# Patient Record
Sex: Female | Born: 1955
Health system: Southern US, Community
[De-identification: ages and names within clinical notes are randomized; demographics above are authoritative.]

## PROBLEM LIST (undated history)

## (undated) DIAGNOSIS — I2510 Atherosclerotic heart disease of native coronary artery without angina pectoris: Secondary | ICD-10-CM

## (undated) DIAGNOSIS — G43909 Migraine, unspecified, not intractable, without status migrainosus: Secondary | ICD-10-CM

## (undated) DIAGNOSIS — F4310 Post-traumatic stress disorder, unspecified: Secondary | ICD-10-CM

## (undated) DIAGNOSIS — D219 Benign neoplasm of connective and other soft tissue, unspecified: Secondary | ICD-10-CM

## (undated) DIAGNOSIS — R918 Other nonspecific abnormal finding of lung field: Secondary | ICD-10-CM

## (undated) DIAGNOSIS — D259 Leiomyoma of uterus, unspecified: Secondary | ICD-10-CM

## (undated) DIAGNOSIS — R000 Tachycardia, unspecified: Secondary | ICD-10-CM

## (undated) DIAGNOSIS — I10 Essential (primary) hypertension: Secondary | ICD-10-CM

## (undated) DIAGNOSIS — I700 Atherosclerosis of aorta: Secondary | ICD-10-CM

## (undated) DIAGNOSIS — R1900 Intra-abdominal and pelvic swelling, mass and lump, unspecified site: Secondary | ICD-10-CM

## (undated) DIAGNOSIS — J309 Allergic rhinitis, unspecified: Secondary | ICD-10-CM

## (undated) HISTORY — DX: Leiomyoma of uterus, unspecified: D25.9

## (undated) HISTORY — DX: Atherosclerotic heart disease of native coronary artery without angina pectoris: I25.10

## (undated) HISTORY — PX: ADENOIDECTOMY: SUR15

## (undated) HISTORY — DX: Tachycardia, unspecified: R00.0

## (undated) HISTORY — DX: Post-traumatic stress disorder, unspecified: F43.10

## (undated) HISTORY — DX: Allergic rhinitis, unspecified: J30.9

## (undated) HISTORY — DX: Atherosclerosis of aorta: I70.0

## (undated) HISTORY — DX: Migraine, unspecified, not intractable, without status migrainosus: G43.909

## (undated) HISTORY — PX: TONSILLECTOMY: SUR1361

---
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
## 1997-11-25 ENCOUNTER — Other Ambulatory Visit: Admission: RE | Admit: 1997-11-25 | Discharge: 1997-11-25 | Payer: Self-pay | Admitting: Obstetrics & Gynecology

## 1997-12-23 ENCOUNTER — Ambulatory Visit (HOSPITAL_COMMUNITY): Admission: RE | Admit: 1997-12-23 | Discharge: 1997-12-23 | Payer: Self-pay | Admitting: Obstetrics & Gynecology

## 1999-05-08 ENCOUNTER — Encounter: Payer: Self-pay | Admitting: *Deleted

## 1999-05-08 ENCOUNTER — Ambulatory Visit (HOSPITAL_COMMUNITY): Admission: RE | Admit: 1999-05-08 | Discharge: 1999-05-08 | Payer: Self-pay | Admitting: *Deleted

## 2000-12-28 ENCOUNTER — Emergency Department (HOSPITAL_COMMUNITY): Admission: EM | Admit: 2000-12-28 | Discharge: 2000-12-29 | Payer: Self-pay | Admitting: Emergency Medicine

## 2001-06-21 ENCOUNTER — Ambulatory Visit (HOSPITAL_COMMUNITY): Admission: RE | Admit: 2001-06-21 | Discharge: 2001-06-21 | Payer: Self-pay | Admitting: Family Medicine

## 2001-06-21 ENCOUNTER — Encounter: Payer: Self-pay | Admitting: Family Medicine

## 2001-08-12 ENCOUNTER — Encounter: Payer: Self-pay | Admitting: Family Medicine

## 2001-08-12 ENCOUNTER — Ambulatory Visit (HOSPITAL_COMMUNITY): Admission: RE | Admit: 2001-08-12 | Discharge: 2001-08-12 | Payer: Self-pay | Admitting: Family Medicine

## 2001-10-17 ENCOUNTER — Emergency Department (HOSPITAL_COMMUNITY): Admission: EM | Admit: 2001-10-17 | Discharge: 2001-10-17 | Payer: Self-pay | Admitting: Emergency Medicine

## 2001-10-17 ENCOUNTER — Encounter: Payer: Self-pay | Admitting: Emergency Medicine

## 2002-08-22 ENCOUNTER — Encounter: Payer: Self-pay | Admitting: Family Medicine

## 2002-08-22 ENCOUNTER — Ambulatory Visit (HOSPITAL_COMMUNITY): Admission: RE | Admit: 2002-08-22 | Discharge: 2002-08-22 | Payer: Self-pay | Admitting: Family Medicine

## 2008-02-17 ENCOUNTER — Emergency Department (HOSPITAL_COMMUNITY): Admission: EM | Admit: 2008-02-17 | Discharge: 2008-02-17 | Payer: Self-pay | Admitting: Emergency Medicine

## 2008-04-04 ENCOUNTER — Emergency Department (HOSPITAL_COMMUNITY): Admission: EM | Admit: 2008-04-04 | Discharge: 2008-04-04 | Payer: Self-pay | Admitting: *Deleted

## 2008-09-05 ENCOUNTER — Emergency Department (HOSPITAL_COMMUNITY): Admission: EM | Admit: 2008-09-05 | Discharge: 2008-09-05 | Payer: Self-pay | Admitting: Emergency Medicine

## 2008-09-15 ENCOUNTER — Emergency Department (HOSPITAL_COMMUNITY): Admission: EM | Admit: 2008-09-15 | Discharge: 2008-09-15 | Payer: Self-pay | Admitting: Emergency Medicine

## 2011-05-05 LAB — URINALYSIS, ROUTINE W REFLEX MICROSCOPIC
Bilirubin Urine: NEGATIVE
Glucose, UA: NEGATIVE
Hgb urine dipstick: NEGATIVE
Ketones, ur: NEGATIVE
Nitrite: NEGATIVE
Protein, ur: NEGATIVE
Specific Gravity, Urine: 1.005
Urobilinogen, UA: 0.2
pH: 6.5

## 2011-09-01 DIAGNOSIS — G43909 Migraine, unspecified, not intractable, without status migrainosus: Secondary | ICD-10-CM | POA: Insufficient documentation

## 2015-03-24 ENCOUNTER — Other Ambulatory Visit (HOSPITAL_COMMUNITY)
Admission: RE | Admit: 2015-03-24 | Discharge: 2015-03-24 | Disposition: A | Discharge status: Home / Self Care | Payer: 59 | Source: Ambulatory Visit | Attending: Physician Assistant | Admitting: Physician Assistant

## 2015-03-24 DIAGNOSIS — Z1151 Encounter for screening for human papillomavirus (HPV): Secondary | ICD-10-CM | POA: Diagnosis present

## 2015-03-24 DIAGNOSIS — Z01419 Encounter for gynecological examination (general) (routine) without abnormal findings: Secondary | ICD-10-CM | POA: Diagnosis not present

## 2016-09-12 DIAGNOSIS — H1032 Unspecified acute conjunctivitis, left eye: Secondary | ICD-10-CM | POA: Diagnosis not present

## 2016-09-27 DIAGNOSIS — H1031 Unspecified acute conjunctivitis, right eye: Secondary | ICD-10-CM | POA: Diagnosis not present

## 2016-10-08 DIAGNOSIS — H1045 Other chronic allergic conjunctivitis: Secondary | ICD-10-CM | POA: Diagnosis not present

## 2016-11-25 ENCOUNTER — Ambulatory Visit
Admission: RE | Admit: 2016-11-25 | Discharge: 2016-11-25 | Disposition: A | Discharge status: Home / Self Care | Payer: 59 | Source: Ambulatory Visit | Attending: Family Medicine | Admitting: Family Medicine

## 2016-11-25 ENCOUNTER — Other Ambulatory Visit: Payer: Self-pay | Admitting: Family Medicine

## 2016-11-25 DIAGNOSIS — M546 Pain in thoracic spine: Secondary | ICD-10-CM

## 2016-11-25 DIAGNOSIS — M549 Dorsalgia, unspecified: Secondary | ICD-10-CM | POA: Diagnosis not present

## 2016-11-25 DIAGNOSIS — M542 Cervicalgia: Secondary | ICD-10-CM | POA: Diagnosis not present

## 2016-11-25 DIAGNOSIS — G43909 Migraine, unspecified, not intractable, without status migrainosus: Secondary | ICD-10-CM | POA: Diagnosis not present

## 2016-12-31 DIAGNOSIS — J301 Allergic rhinitis due to pollen: Secondary | ICD-10-CM | POA: Diagnosis not present

## 2016-12-31 DIAGNOSIS — R0602 Shortness of breath: Secondary | ICD-10-CM | POA: Diagnosis not present

## 2017-01-03 ENCOUNTER — Other Ambulatory Visit: Payer: Self-pay | Admitting: Family Medicine

## 2017-01-03 ENCOUNTER — Ambulatory Visit
Admission: RE | Admit: 2017-01-03 | Discharge: 2017-01-03 | Disposition: A | Discharge status: Home / Self Care | Payer: 59 | Source: Ambulatory Visit | Attending: Family Medicine | Admitting: Family Medicine

## 2017-01-03 DIAGNOSIS — R0602 Shortness of breath: Secondary | ICD-10-CM

## 2017-02-16 DIAGNOSIS — M79605 Pain in left leg: Secondary | ICD-10-CM | POA: Diagnosis not present

## 2017-02-24 DIAGNOSIS — I8002 Phlebitis and thrombophlebitis of superficial vessels of left lower extremity: Secondary | ICD-10-CM | POA: Diagnosis not present

## 2017-05-03 ENCOUNTER — Other Ambulatory Visit: Payer: Self-pay | Admitting: Family Medicine

## 2017-05-03 ENCOUNTER — Other Ambulatory Visit (HOSPITAL_COMMUNITY)
Admission: RE | Admit: 2017-05-03 | Discharge: 2017-05-03 | Disposition: A | Discharge status: Home / Self Care | Payer: 59 | Source: Ambulatory Visit | Attending: Family Medicine | Admitting: Family Medicine

## 2017-05-03 DIAGNOSIS — Z23 Encounter for immunization: Secondary | ICD-10-CM | POA: Diagnosis not present

## 2017-05-03 DIAGNOSIS — Z124 Encounter for screening for malignant neoplasm of cervix: Secondary | ICD-10-CM | POA: Diagnosis not present

## 2017-05-03 DIAGNOSIS — R102 Pelvic and perineal pain: Secondary | ICD-10-CM | POA: Diagnosis not present

## 2017-05-05 ENCOUNTER — Other Ambulatory Visit: Payer: Self-pay | Admitting: Family Medicine

## 2017-05-05 DIAGNOSIS — N852 Hypertrophy of uterus: Secondary | ICD-10-CM

## 2017-05-05 DIAGNOSIS — R102 Pelvic and perineal pain: Secondary | ICD-10-CM

## 2017-05-05 DIAGNOSIS — D259 Leiomyoma of uterus, unspecified: Secondary | ICD-10-CM

## 2017-05-05 LAB — CYTOLOGY - PAP
Bacterial vaginitis: NEGATIVE
CANDIDA VAGINITIS: NEGATIVE
Chlamydia: NEGATIVE
DIAGNOSIS: NEGATIVE
Neisseria Gonorrhea: NEGATIVE
TRICH (WINDOWPATH): NEGATIVE

## 2017-05-12 ENCOUNTER — Other Ambulatory Visit: Payer: 59

## 2017-10-30 DIAGNOSIS — N390 Urinary tract infection, site not specified: Secondary | ICD-10-CM | POA: Diagnosis not present

## 2017-10-30 DIAGNOSIS — R0602 Shortness of breath: Secondary | ICD-10-CM | POA: Diagnosis not present

## 2017-10-30 DIAGNOSIS — Z77120 Contact with and (suspected) exposure to mold (toxic): Secondary | ICD-10-CM | POA: Diagnosis not present

## 2017-11-15 DIAGNOSIS — R030 Elevated blood-pressure reading, without diagnosis of hypertension: Secondary | ICD-10-CM | POA: Diagnosis not present

## 2017-11-15 DIAGNOSIS — T7840XA Allergy, unspecified, initial encounter: Secondary | ICD-10-CM | POA: Diagnosis not present

## 2017-12-13 ENCOUNTER — Encounter: Payer: Self-pay | Admitting: Allergy and Immunology

## 2017-12-23 DIAGNOSIS — J069 Acute upper respiratory infection, unspecified: Secondary | ICD-10-CM | POA: Diagnosis not present

## 2018-01-24 ENCOUNTER — Ambulatory Visit: Payer: 59 | Admitting: Allergy and Immunology

## 2018-01-24 ENCOUNTER — Encounter: Payer: Self-pay | Admitting: Allergy and Immunology

## 2018-01-24 VITALS — BP 124/74 | HR 64 | Temp 98.3°F | Resp 18 | Ht 63.0 in | Wt 145.8 lb

## 2018-01-24 DIAGNOSIS — J3089 Other allergic rhinitis: Secondary | ICD-10-CM | POA: Diagnosis not present

## 2018-01-24 DIAGNOSIS — H1010 Acute atopic conjunctivitis, unspecified eye: Secondary | ICD-10-CM

## 2018-01-24 DIAGNOSIS — J4520 Mild intermittent asthma, uncomplicated: Secondary | ICD-10-CM | POA: Diagnosis not present

## 2018-01-24 NOTE — Progress Notes (Signed)
Dear Marilynne Drivers,  Thank you for referring Jane Price to the [facility] on 01/24/2018.   Below is a summation of this patient's evaluation and recommendations.  Thank you for your referral. I will keep you informed about this patient's response to treatment.   If you have any questions please do not hesitate to contact me.   Sincerely,  Jiles Prows, MD Allergy / Immunology [facility]   ______________________________________________________________________    NEW PATIENT NOTE  Referring Provider: Lois Huxley, [place] Primary Provider: Antony Contras, MD Date of office visit: 01/24/2018    Subjective:   Chief Complaint:  Irene Collings (DOB: 12-18-1955) is a 62 y.o. female who presents to the clinic on 01/24/2018 with a chief complaint of Allergic Rhinitis  and Asthma .     HPI: Makynli presents to this clinic in evaluation of allergies.  She has apparently been seen in this clinic over 10 years ago for issues with allergic rhinoconjunctivitis.  She was doing relatively well until February 2019 at which point in time she believed that she had exposure to mold in her house.  At that point time she became quite sick with nasal congestion and shortness of breath and sneezing and sinus headache and she went to an urgent care center and she was treated with a systemic steroid and given a short acting bronchodilator and nasal steroids and antihistamines.  When she is out of the house living in a new rental house she does much better.  When she goes back to the house to pick up material and furnishings she finds that she develops the problem once again.  In addition, sometime around February she was eating eggs and she developed a "knot in her throat".  There did not appear to be a change in talking and she could swallow food without any problem.  She also started to develop very fast  breathing and she used her short acting bronchodilator that was given to her by the urgent care center.  That was actually the first time that she used that inhaler and within 15 minutes everything resolved.  EMTs did arrive at the house for assessment but did not recommend that she go to the hospital.  She has not eaten any egg products since that event.  She also states that sometimes she has problems swallowing rice.  Neither eggs or rice ended up getting stuck in her chest rather it was an issue with transitioning the food from her mouth further down her GI tract.  History reviewed. No pertinent past medical history.  Past Surgical History:  Procedure Laterality Date  . ADENOIDECTOMY    . CESAREAN SECTION    . TONSILLECTOMY      Allergies as of 01/24/2018      Reactions   Erythromycin Base Other (See Comments)   Penicillins Rash   Sulfamethoxazole Rash      Medication List      albuterol 108 (90 Base) MCG/ACT inhaler Commonly known as:  PROVENTIL HFA;VENTOLIN HFA Inhale 1 puff into the lungs every 6 (six) hours as needed for wheezing or shortness of breath.   EXCEDRIN PO Take by mouth.   fexofenadine 180 MG tablet Commonly known as:  ALLEGRA Take 180 mg by mouth daily.   fluticasone 50 MCG/ACT nasal spray Commonly known as:  FLONASE Place 1 spray into both nostrils daily.   ibuprofen 200 MG tablet  Commonly known as:  ADVIL,MOTRIN Take 200 mg by mouth every 6 (six) hours as needed.   loteprednol 0.5 % ophthalmic suspension Commonly known as:  LOTEMAX Place 1 drop into both eyes daily as needed.   PATADAY 0.2 % Soln Generic drug:  Olopatadine HCl Apply 1 drop to eye daily.   ranitidine 75 MG tablet Commonly known as:  ZANTAC Take 75 mg by mouth daily.   rizatriptan 10 MG disintegrating tablet Commonly known as:  MAXALT-MLT Take 1 tablet at onset of migraine. May repeat in 2 hours if needed.       Review of systems negative except as noted in HPI / PMHx or  noted below:  Review of Systems  Constitutional: Negative.   HENT: Negative.   Eyes: Negative.   Respiratory: Negative.   Cardiovascular: Negative.   Gastrointestinal: Negative.   Genitourinary: Negative.   Musculoskeletal: Negative.   Skin: Negative.   Neurological: Negative.   Endo/Heme/Allergies: Negative.   Psychiatric/Behavioral: Negative.     Family History  Problem Relation Age of Onset  . COPD Mother     Social History   Socioeconomic History  . Marital status: Married    Spouse name: Not on file  . Number of children: Not on file  . Years of education: Not on file  . Highest education level: Not on file  Occupational History  . Not on file  Social Needs  . Financial resource strain: Not on file  . Food insecurity:    Worry: Not on file    Inability: Not on file  . Transportation needs:    Medical: Not on file    Non-medical: Not on file  Tobacco Use  . Smoking status: Never Smoker  . Smokeless tobacco: Never Used  Substance and Sexual Activity  . Alcohol use: Never    Frequency: Never  . Drug use: Never  . Sexual activity: Not on file  Lifestyle  . Physical activity:    Days per week: Not on file    Minutes per session: Not on file  . Stress: Not on file  Relationships  . Social connections:    Talks on phone: Not on file    Gets together: Not on file    Attends religious service: Not on file    Active member of club or organization: Not on file    Attends meetings of clubs or organizations: Not on file    Relationship status: Not on file  . Intimate partner violence:    Fear of current or ex partner: Not on file    Emotionally abused: Not on file    Physically abused: Not on file    Forced sexual activity: Not on file  Other Topics Concern  . Not on file  Social History Narrative  . Not on file    Environmental and Social history  Charline presently lives in a rental house with a dry environment, cats located inside the household, no  carpet in the bedroom, sleeping on a bed covered in plastic, sleeping on pillows with plastic, no smokers located inside the household.  Objective:   Vitals:   01/24/18 1343  BP: 124/74  Pulse: 64  Resp: 18  Temp: 98.3 F (36.8 C)  SpO2: 98%   Height: 5\' 3"  (160 cm) Weight: 145 lb 12.8 oz (66.1 kg)  Physical Exam  HENT:  Head: Normocephalic. Head is without right periorbital erythema and without left periorbital erythema.  Right Ear: Tympanic membrane, external ear and ear  canal normal.  Left Ear: Tympanic membrane, external ear and ear canal normal.  Nose: Nose normal. No mucosal edema or rhinorrhea.  Mouth/Throat: Oropharynx is clear and moist and mucous membranes are normal. No oropharyngeal exudate.  Eyes: Pupils are equal, round, and reactive to light. Conjunctivae and lids are normal.  Neck: Trachea normal. No tracheal deviation present. No thyromegaly present.  Cardiovascular: Normal rate, regular rhythm, S1 normal, S2 normal and normal heart sounds.  No murmur heard. Pulmonary/Chest: Effort normal. No stridor. No respiratory distress. She has no wheezes. She has no rales. She exhibits no tenderness.  Abdominal: Soft. She exhibits mass (Protuberant palpable fibroid). She exhibits no distension. There is no hepatosplenomegaly. There is no tenderness. There is no rebound and no guarding.  Musculoskeletal: She exhibits no edema or tenderness.  Lymphadenopathy:       Head (right side): No tonsillar adenopathy present.       Head (left side): No tonsillar adenopathy present.    She has no cervical adenopathy.    She has no axillary adenopathy.  Neurological: She is alert.  Skin: No rash noted. She is not diaphoretic. No erythema. No pallor. Nails show no clubbing.    Diagnostics: Allergy skin tests were performed.  She did not demonstrate any hypersensitivity to egg or rice.  Spirometry was performed and demonstrated an FEV1 of 2.26 @ 95 % of predicted. FEV1/FVC =  0.88  Assessment and Plan:    1. Other allergic rhinitis   2. Seasonal allergic conjunctivitis   3. Asthma, mild intermittent, well-controlled     1.  Utilize dehumidifier to lower relative humidity to below 50%  2.  Continue Flonase 1-2 sprays each nostril 3-7 times per week  3.  Continue OTC antihistamine and Pataday as albuterol MDI needed  4.  Further evaluation?  Yes, if with recurrent problems.  Catera appeared to have some issues with mold exposure and I made some suggestions that she can utilize to dehumidify her previous residence so that she can recover her furnishings and clothings without mold contamination.  She will continue to utilize anti-inflammatory agents for her upper airways and if needed a collection of other medications as noted above.  I have told her that she did not have any evidence of significant allergy directed against egg and she can go back to eating egg at this point.  Certainly if she has a significant problem that develops in the future she can return to this clinic for further evaluation.  Jiles Prows, MD Allergy / Immunology [facility]

## 2018-01-24 NOTE — Patient Instructions (Addendum)
  1.  Utilize dehumidifier to lower relative humidity to below 50%  2.  Continue Flonase 1-2 sprays each nostril 3-7 times per week  3.  Continue OTC antihistamine and Pataday as albuterol MDI needed  4.  Further evaluation?  Yes, if with recurrent problems.

## 2018-01-25 ENCOUNTER — Encounter: Payer: Self-pay | Admitting: Allergy and Immunology

## 2018-03-25 DIAGNOSIS — M25512 Pain in left shoulder: Secondary | ICD-10-CM | POA: Diagnosis not present

## 2018-04-12 DIAGNOSIS — S0083XA Contusion of other part of head, initial encounter: Secondary | ICD-10-CM | POA: Diagnosis not present

## 2018-04-14 DIAGNOSIS — M542 Cervicalgia: Secondary | ICD-10-CM | POA: Diagnosis not present

## 2018-04-14 DIAGNOSIS — G43909 Migraine, unspecified, not intractable, without status migrainosus: Secondary | ICD-10-CM | POA: Diagnosis not present

## 2018-04-14 DIAGNOSIS — R58 Hemorrhage, not elsewhere classified: Secondary | ICD-10-CM | POA: Diagnosis not present

## 2018-04-14 DIAGNOSIS — Z23 Encounter for immunization: Secondary | ICD-10-CM | POA: Diagnosis not present

## 2018-07-27 DIAGNOSIS — R030 Elevated blood-pressure reading, without diagnosis of hypertension: Secondary | ICD-10-CM | POA: Diagnosis not present

## 2018-07-27 DIAGNOSIS — J019 Acute sinusitis, unspecified: Secondary | ICD-10-CM | POA: Diagnosis not present

## 2018-07-27 DIAGNOSIS — D229 Melanocytic nevi, unspecified: Secondary | ICD-10-CM | POA: Diagnosis not present

## 2018-08-04 DIAGNOSIS — D1801 Hemangioma of skin and subcutaneous tissue: Secondary | ICD-10-CM | POA: Diagnosis not present

## 2018-08-04 DIAGNOSIS — L821 Other seborrheic keratosis: Secondary | ICD-10-CM | POA: Diagnosis not present

## 2018-08-04 DIAGNOSIS — Z85828 Personal history of other malignant neoplasm of skin: Secondary | ICD-10-CM | POA: Diagnosis not present

## 2018-08-25 ENCOUNTER — Emergency Department: Payer: 59

## 2018-08-25 ENCOUNTER — Emergency Department
Admission: EM | Admit: 2018-08-25 | Discharge: 2018-08-25 | Disposition: A | Discharge status: Home / Self Care | Payer: 59 | Attending: Emergency Medicine | Admitting: Emergency Medicine

## 2018-08-25 ENCOUNTER — Other Ambulatory Visit: Payer: Self-pay

## 2018-08-25 DIAGNOSIS — R079 Chest pain, unspecified: Secondary | ICD-10-CM | POA: Insufficient documentation

## 2018-08-25 DIAGNOSIS — I10 Essential (primary) hypertension: Secondary | ICD-10-CM | POA: Insufficient documentation

## 2018-08-25 DIAGNOSIS — R911 Solitary pulmonary nodule: Secondary | ICD-10-CM | POA: Diagnosis not present

## 2018-08-25 DIAGNOSIS — Z880 Allergy status to penicillin: Secondary | ICD-10-CM | POA: Diagnosis not present

## 2018-08-25 DIAGNOSIS — M79605 Pain in left leg: Secondary | ICD-10-CM | POA: Diagnosis not present

## 2018-08-25 DIAGNOSIS — Z79899 Other long term (current) drug therapy: Secondary | ICD-10-CM | POA: Diagnosis not present

## 2018-08-25 DIAGNOSIS — M791 Myalgia, unspecified site: Secondary | ICD-10-CM

## 2018-08-25 DIAGNOSIS — M79662 Pain in left lower leg: Secondary | ICD-10-CM | POA: Diagnosis not present

## 2018-08-25 HISTORY — DX: Essential (primary) hypertension: I10

## 2018-08-25 HISTORY — DX: Benign neoplasm of connective and other soft tissue, unspecified: D21.9

## 2018-08-25 LAB — BASIC METABOLIC PANEL
Anion gap: 9 (ref 5–15)
BUN: 9 mg/dL (ref 8–23)
CO2: 25 mmol/L (ref 22–32)
Calcium: 9.4 mg/dL (ref 8.9–10.3)
Chloride: 101 mmol/L (ref 98–111)
Creatinine, Ser: 0.74 mg/dL (ref 0.44–1.00)
GFR calc Af Amer: >60 mL/min (ref >60)
GFR calc non Af Amer: >60 mL/min (ref >60)
GLUCOSE: 110 mg/dL — AB (ref 70–99)
Potassium: 3.5 mmol/L (ref 3.5–5.1)
Sodium: 135 mmol/L (ref 135–145)

## 2018-08-25 LAB — CBC
HEMATOCRIT: 37.6 % (ref 36.0–46.0)
Hemoglobin: 12.1 g/dL (ref 12.0–15.0)
MCH: 28.1 pg (ref 26.0–34.0)
MCHC: 32.2 g/dL (ref 30.0–36.0)
MCV: 87.4 fL (ref 80.0–100.0)
Platelets: 338 10*3/uL (ref 150–400)
RBC: 4.3 MIL/uL (ref 3.87–5.11)
RDW: 14.2 % (ref 11.5–15.5)
WBC: 7.7 10*3/uL (ref 4.0–10.5)
nRBC: 0 % (ref 0.0–0.2)

## 2018-08-25 LAB — TROPONIN I
Troponin I: <0.03 ng/mL (ref <0.03)
Troponin I: <0.03 ng/mL (ref <0.03)

## 2018-08-25 LAB — FIBRIN DERIVATIVES D-DIMER (ARMC ONLY): Fibrin derivatives D-dimer (ARMC): 959.19 ng/mL (FEU) — ABNORMAL HIGH (ref 0.00–499.00)

## 2018-08-25 MED ORDER — IOHEXOL 350 MG/ML SOLN
75.0000 mL | Freq: Once | INTRAVENOUS | Status: AC | PRN
  Administered 2018-08-25: 75 mL via INTRAVENOUS

## 2018-08-25 MED ORDER — SODIUM CHLORIDE 0.9% FLUSH
3.0000 mL | Freq: Once | INTRAVENOUS | Status: AC
  Administered 2018-08-25: 3 mL via INTRAVENOUS

## 2018-08-25 MED ORDER — ASPIRIN EC 81 MG PO TBEC
162.0000 mg | DELAYED_RELEASE_TABLET | Freq: Once | ORAL | Status: AC
  Administered 2018-08-25: 162 mg via ORAL
  Filled 2018-08-25: qty 2

## 2018-08-25 NOTE — ED Triage Notes (Addendum)
C/o intermittent burning and achy chest pain that radiates to Lside neck, shoulder and arm for a few days. Also c/o Left lower leg pain. PAtient recently drove to [place] and back on 1/13. No swelling or redness noted to leg. Pt c/o sharp pain to Left leg

## 2018-08-25 NOTE — ED Provider Notes (Signed)
[facility] Emergency Department Provider Note   ___________________________________________   First MD Initiated Contact with Patient 08/25/18 1845     (approximate)  I have reviewed the triage vital signs and the nursing notes.   HISTORY  Chief Complaint Chest Pain   HPI Jane Price is a 63 y.o. female with history of fibroid uterus was presented emergency department with 2 days of numbness to the left shoulder and then chest pain for several hours this afternoon.  Says that she also felt shortness of breath during episode of chest pain this afternoon which felt like a pressure and burning type pain over the center of the chest.  It was nonradiating.  Improved when the patient rested and she says that she has been pain-free over the past several hours.  Also with aching to the left lower extremity which she said happened after a long car trip with her daughter earlier this month.  Denies any swelling to the bilateral lower extremities.  No cardiac history.  No smoking drinking or drugs.  No family history of cardiac disease.   Past Medical History:  Diagnosis Date  . Fibroids   . Hypertension     There are no active problems to display for this patient.   Past Surgical History:  Procedure Laterality Date  . ADENOIDECTOMY    . CESAREAN SECTION    . TONSILLECTOMY      Prior to Admission medications   Medication Sig Start Date End Date Taking? Authorizing Provider  albuterol (PROVENTIL HFA;VENTOLIN HFA) 108 (90 Base) MCG/ACT inhaler Inhale 1 puff into the lungs every 6 (six) hours as needed for wheezing or shortness of breath.    [provider]  Aspirin-Acetaminophen-Caffeine (EXCEDRIN PO) Take by mouth.    [provider]  fexofenadine (ALLEGRA) 180 MG tablet Take 180 mg by mouth daily.    [provider]  fluticasone (FLONASE) 50 MCG/ACT nasal spray Place 1 spray into both nostrils daily.    [provider]   ibuprofen (ADVIL,MOTRIN) 200 MG tablet Take 200 mg by mouth every 6 (six) hours as needed.    [provider]  loteprednol (LOTEMAX) 0.5 % ophthalmic suspension Place 1 drop into both eyes daily as needed.    [provider]  Olopatadine HCl (PATADAY) 0.2 % SOLN Apply 1 drop to eye daily.    [provider]  ranitidine (ZANTAC) 75 MG tablet Take 75 mg by mouth daily.    [provider]  rizatriptan (MAXALT-MLT) 10 MG disintegrating tablet Take 1 tablet at onset of migraine. May repeat in 2 hours if needed. 02/10/12   [provider]    Allergies Erythromycin base; Penicillins; and Sulfamethoxazole  Family History  Problem Relation Age of Onset  . COPD Mother     Social History Social History   Tobacco Use  . Smoking status: Never Smoker  . Smokeless tobacco: Never Used  Substance Use Topics  . Alcohol use: Never    Frequency: Never  . Drug use: Never    Review of Systems  Constitutional: No fever/chills Eyes: No visual changes. ENT: No sore throat. Cardiovascular: As above Respiratory: As above Gastrointestinal: No abdominal pain.  No nausea, no vomiting.  No diarrhea.  No constipation. Genitourinary: Negative for dysuria. Musculoskeletal: Negative for back pain. Skin: Negative for rash. Neurological: Negative for headaches, focal weakness or numbness.   ____________________________________________   PHYSICAL EXAM:  VITAL SIGNS: ED Triage Vitals  Enc Vitals Group  BP 08/25/18 1900 (!) 151/90     Pulse Rate 08/25/18 1900 67     Resp 08/25/18 1900 (!) 21     Temp --      Temp src --      SpO2 08/25/18 1900 100 %     Weight 08/25/18 1657 140 lb (63.5 kg)     Height 08/25/18 1657 5\' 3"  (1.6 m)     Head Circumference --      Peak Flow --      Pain Score 08/25/18 1656 6     Pain Loc --      Pain Edu? --      Excl. in [place]? --     Constitutional: Alert and oriented. Well appearing and in no acute distress. Eyes:  Conjunctivae are normal.  Head: Atraumatic. Nose: No congestion/rhinnorhea. Mouth/Throat: Mucous membranes are moist.  Neck: No stridor.   Cardiovascular: Normal rate, regular rhythm. Grossly normal heart sounds.  Good peripheral circulation with equal and bilateral radial as well as dorsalis pedis pulses.  Chest pain not reproducible to palpation. Respiratory: Normal respiratory effort.  No retractions. Lungs CTAB. Gastrointestinal: Soft and nontender. No distention. No CVA tenderness. Musculoskeletal: No lower extremity tenderness nor edema.  No joint effusions. Neurologic:  Normal speech and language. No gross focal neurologic deficits are appreciated. Skin:  Skin is warm, dry and intact. No rash noted. Psychiatric: Mood and affect are normal. Speech and behavior are normal.  ____________________________________________   LABS (all labs ordered are listed, but only abnormal results are displayed)  Labs Reviewed  BASIC METABOLIC PANEL - Abnormal; Notable for the following components:      Result Value   Glucose, Bld 110 (*)    All other components within normal limits  FIBRIN DERIVATIVES D-DIMER ([facility] ONLY) - Abnormal; Notable for the following components:   Fibrin derivatives D-dimer ([facility]) 959.19 (*)    All other components within normal limits  CBC  TROPONIN I  TROPONIN I   ____________________________________________  EKG  ED ECG REPORT I, Doran Stabler, the attending physician, personally viewed and interpreted this ECG.   Date: 08/25/2018  EKG Time: 1655  Rate: 89  Rhythm: normal sinus rhythm  Axis: Normal  Intervals:none  ST&T Change: No ST segment elevation or depression.  No abnormal T wave inversion.  ____________________________________________  RADIOLOGY  Chest x-ray without acute process.  Ultrasound of the left lower extremity with no evidence of DVT.  CT angiography without PE.  However, multiple scattered nodules within the lungs bilaterally.   Dominant nodule 8 mm in the left lower lobe. ____________________________________________   PROCEDURES  Procedure(s) performed:   Procedures  Critical Care performed:   ____________________________________________   INITIAL IMPRESSION / ASSESSMENT AND PLAN / ED COURSE  Pertinent labs & imaging results that were available during my care of the patient were reviewed by me and considered in my medical decision making (see chart for details).  Differential diagnosis includes, but is not limited to, ACS, aortic dissection, pulmonary embolism, cardiac tamponade, pneumothorax, pneumonia, pericarditis, myocarditis, GI-related causes including esophagitis/gastritis, and musculoskeletal chest wall pain.   As part of my medical decision making, I reviewed the following data within the [facility] Notes from prior outpatient visits  ----------------------------------------- 10:37 PM on 08/25/2018 -----------------------------------------  Patient continues to be asymptomatic at this time.  No chest pain.  No evidence of PE or DVT on imaging results.  However, we did discuss the pulmonary nodules.  She also asked about  the cramping in her lower extremities which may be due to musculoskeletal pain.  Patient is not on a statin.  Patient will given follow-up with cardiology as well as recommended follow-up with her primary care doctor for scheduling of a repeat CT scan for following of the nodules.  She is understanding the diagnosis well treatment and willing to comply. ____________________________________________   FINAL CLINICAL IMPRESSION(S) / ED DIAGNOSES  Chest pain.  Pulmonary nodules.  Myalgia  NEW MEDICATIONS STARTED DURING THIS VISIT:  New Prescriptions   No medications on file     Note:  This document was prepared using Dragon voice recognition software and may include unintentional dictation errors.     Orbie Pyo, MD 08/25/18 [phone]

## 2018-08-28 DIAGNOSIS — M542 Cervicalgia: Secondary | ICD-10-CM | POA: Diagnosis not present

## 2018-08-28 DIAGNOSIS — R911 Solitary pulmonary nodule: Secondary | ICD-10-CM | POA: Diagnosis not present

## 2018-09-11 DIAGNOSIS — M546 Pain in thoracic spine: Secondary | ICD-10-CM | POA: Insufficient documentation

## 2018-09-11 DIAGNOSIS — M542 Cervicalgia: Secondary | ICD-10-CM | POA: Diagnosis not present

## 2018-09-11 DIAGNOSIS — G8929 Other chronic pain: Secondary | ICD-10-CM | POA: Insufficient documentation

## 2019-03-02 ENCOUNTER — Other Ambulatory Visit: Payer: Self-pay | Admitting: Family Medicine

## 2019-03-02 DIAGNOSIS — R911 Solitary pulmonary nodule: Secondary | ICD-10-CM

## 2019-03-27 IMAGING — CR DG CHEST 2V
2 series · 2 of 2 positions shown · non-contrast
Comparison: None.

CLINICAL DATA: Shortness of breath for 1 year

EXAM:
CHEST  2 VIEW

[w chest pa]
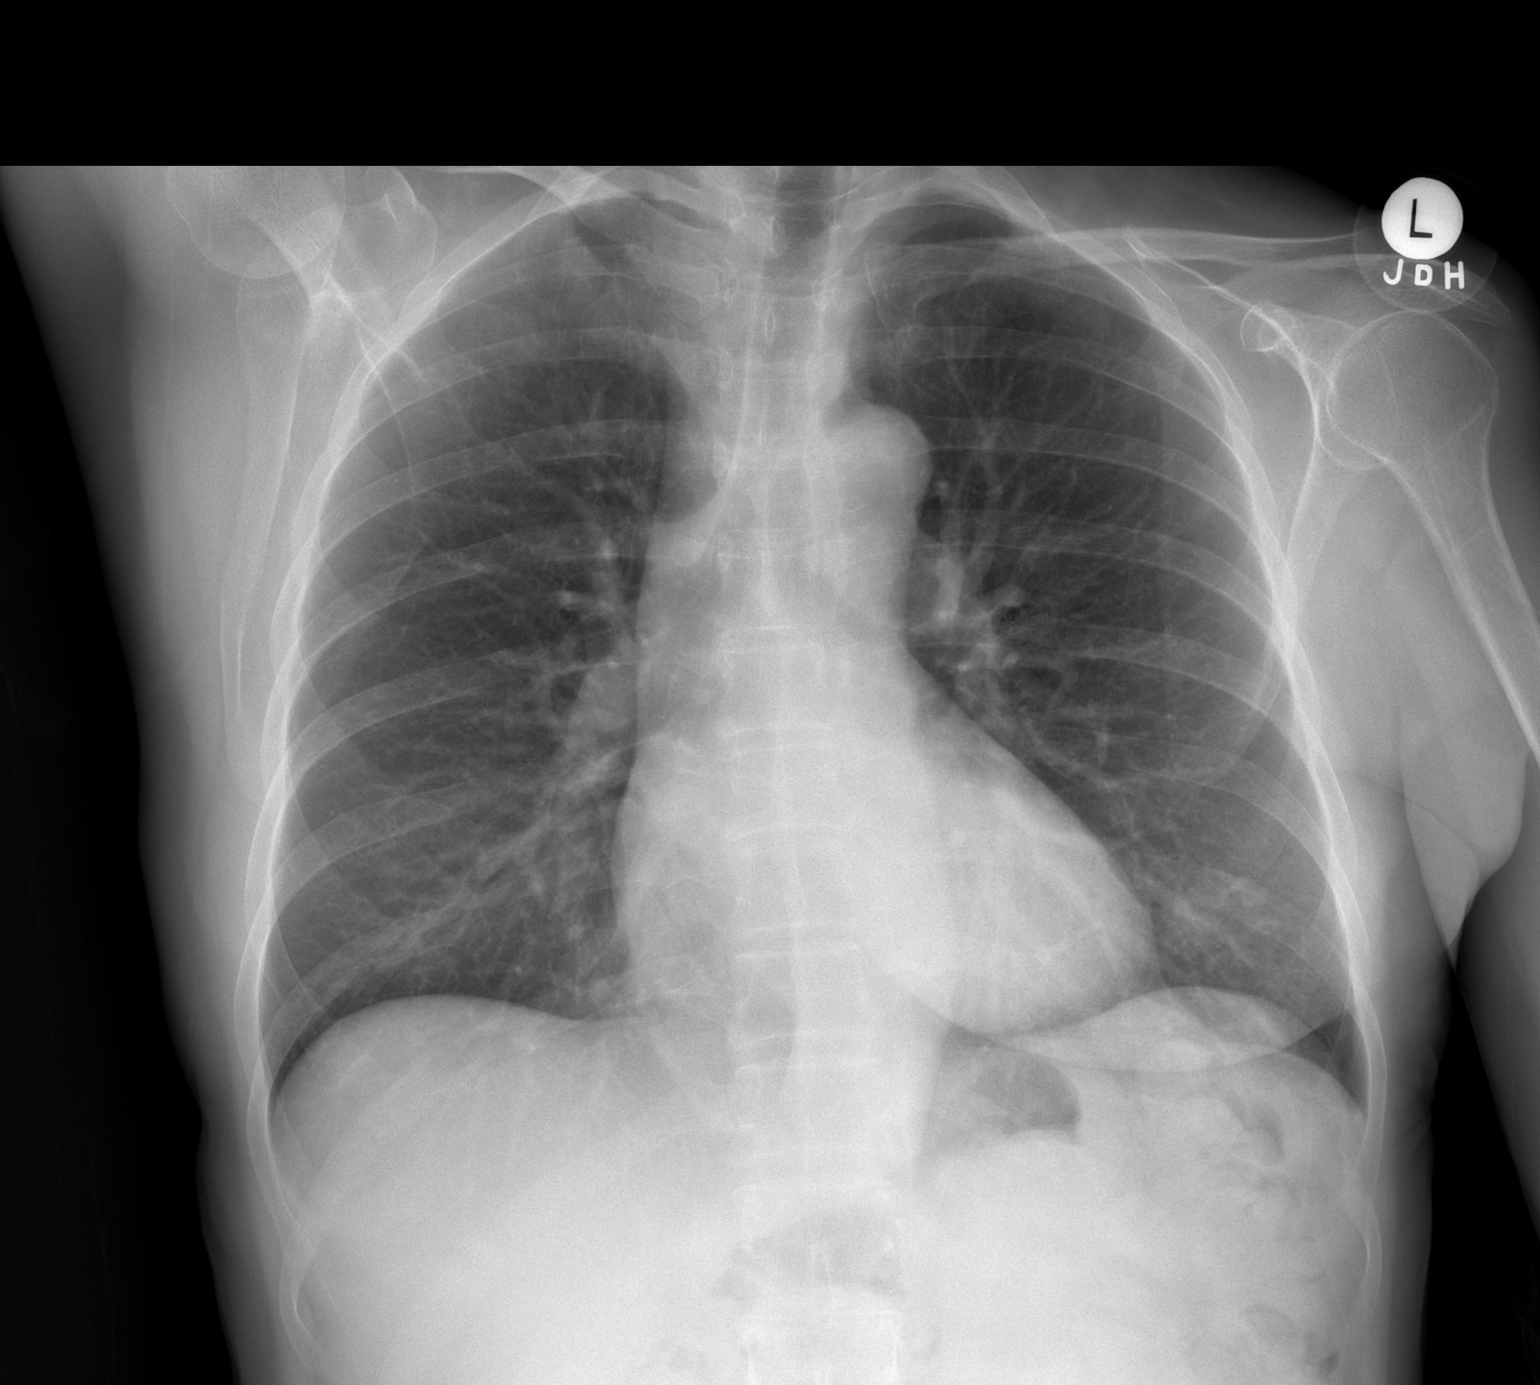

[w chest lat]
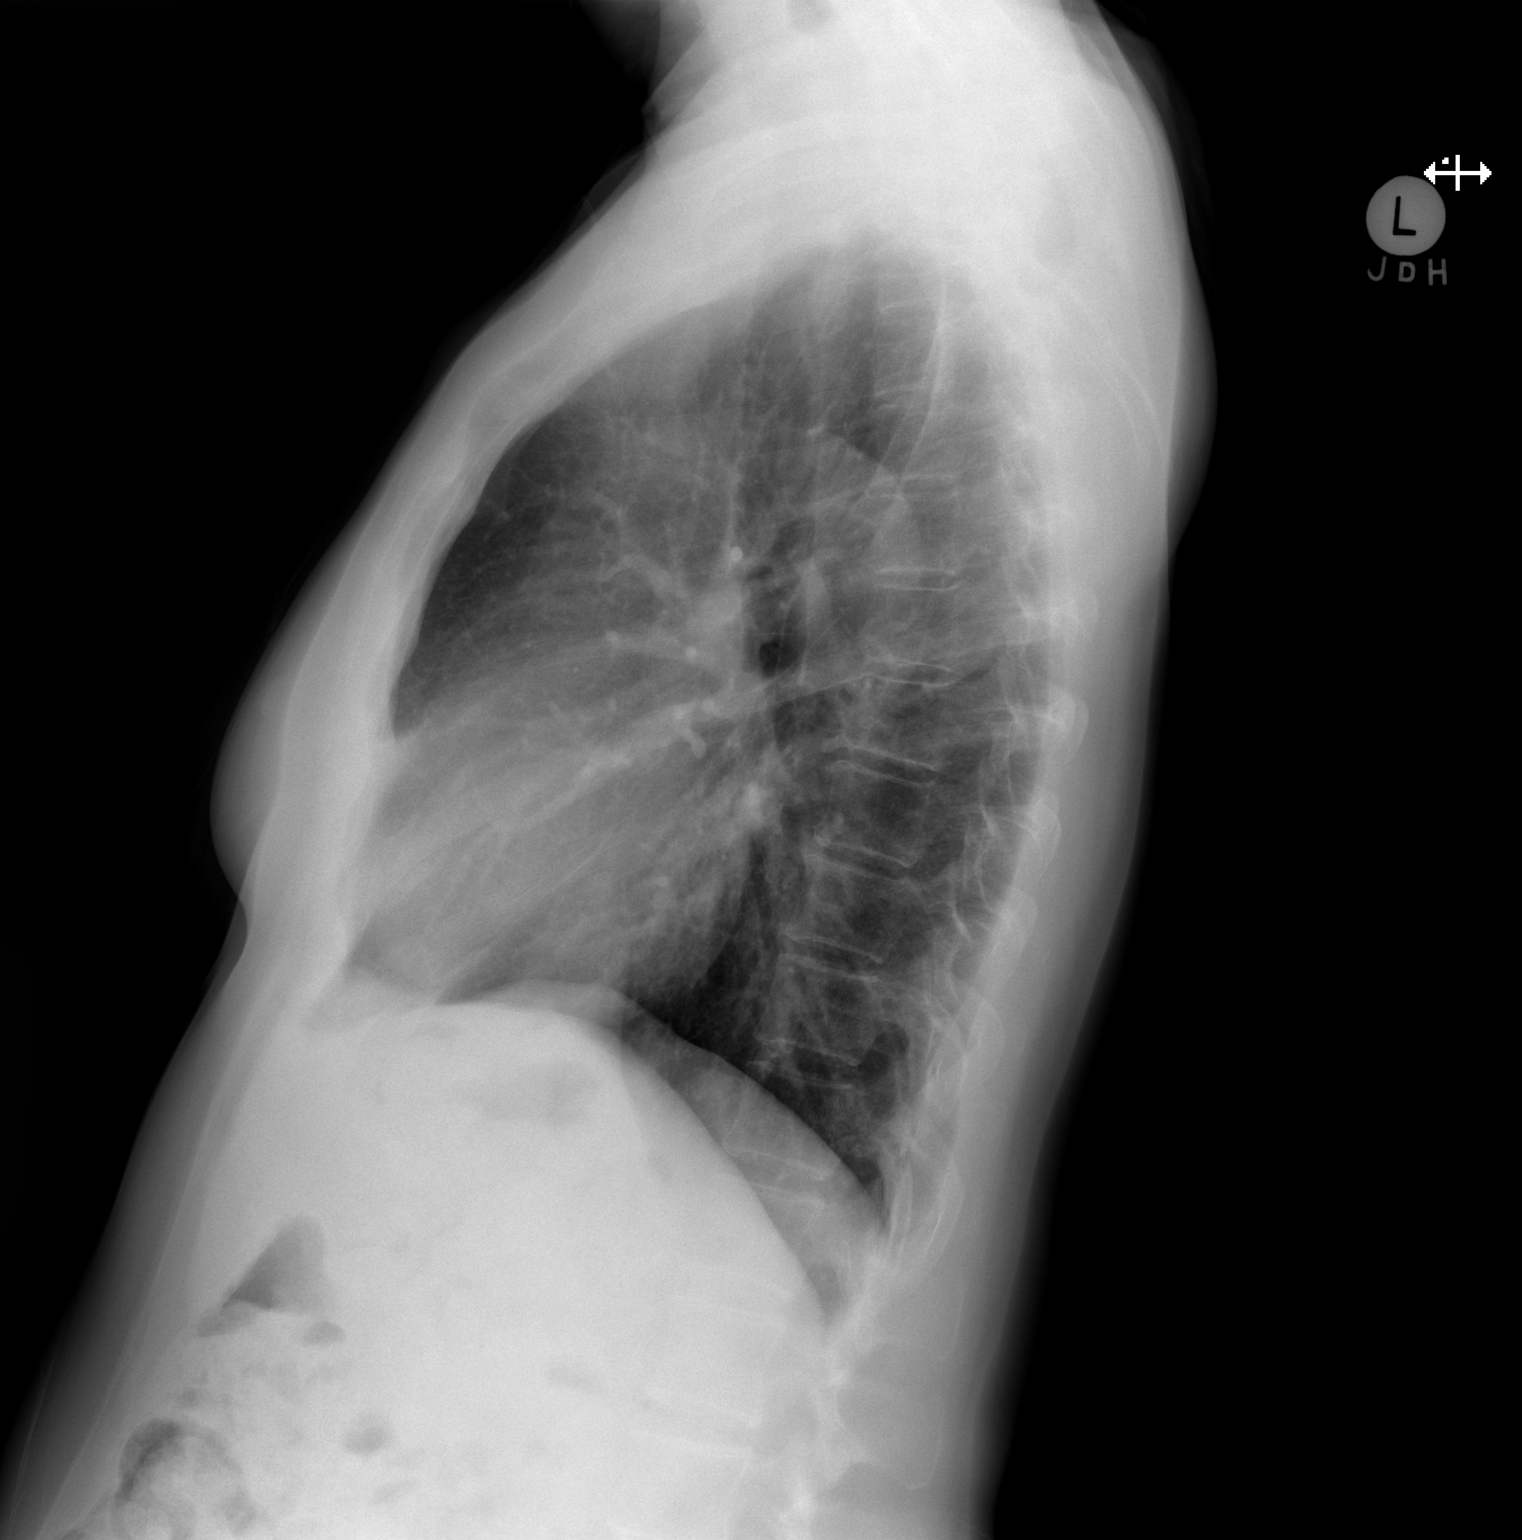

[2 of 2 positions shown; findings below may reference images not displayed]

FINDINGS: Lungs are clear. Heart size and pulmonary vascularity are normal. No
adenopathy. No bone lesions.
IMPRESSION: No edema or consolidation.

## 2019-04-03 DIAGNOSIS — R918 Other nonspecific abnormal finding of lung field: Secondary | ICD-10-CM

## 2019-04-03 DIAGNOSIS — R1900 Intra-abdominal and pelvic swelling, mass and lump, unspecified site: Secondary | ICD-10-CM

## 2019-04-03 HISTORY — DX: Intra-abdominal and pelvic swelling, mass and lump, unspecified site: R19.00

## 2019-04-03 HISTORY — DX: Other nonspecific abnormal finding of lung field: R91.8

## 2019-04-04 ENCOUNTER — Ambulatory Visit
Admission: RE | Admit: 2019-04-04 | Discharge: 2019-04-04 | Disposition: A | Discharge status: Home / Self Care | Payer: 59 | Source: Ambulatory Visit | Attending: Family Medicine | Admitting: Family Medicine

## 2019-04-04 DIAGNOSIS — R911 Solitary pulmonary nodule: Secondary | ICD-10-CM

## 2019-04-30 ENCOUNTER — Ambulatory Visit (INDEPENDENT_AMBULATORY_CARE_PROVIDER_SITE_OTHER): Payer: 59 | Admitting: Internal Medicine

## 2019-04-30 ENCOUNTER — Encounter: Payer: Self-pay | Admitting: Internal Medicine

## 2019-04-30 ENCOUNTER — Other Ambulatory Visit: Payer: Self-pay

## 2019-04-30 VITALS — BP 138/68 | HR 57 | Temp 97.2°F | Ht 63.0 in | Wt 142.8 lb

## 2019-04-30 DIAGNOSIS — R0609 Other forms of dyspnea: Secondary | ICD-10-CM | POA: Diagnosis not present

## 2019-04-30 DIAGNOSIS — R0600 Dyspnea, unspecified: Secondary | ICD-10-CM

## 2019-04-30 DIAGNOSIS — I700 Atherosclerosis of aorta: Secondary | ICD-10-CM | POA: Diagnosis not present

## 2019-04-30 DIAGNOSIS — R0789 Other chest pain: Secondary | ICD-10-CM | POA: Diagnosis not present

## 2019-04-30 DIAGNOSIS — I2510 Atherosclerotic heart disease of native coronary artery without angina pectoris: Secondary | ICD-10-CM

## 2019-04-30 DIAGNOSIS — I2584 Coronary atherosclerosis due to calcified coronary lesion: Secondary | ICD-10-CM

## 2019-04-30 NOTE — Patient Instructions (Signed)
Medication Instructions:  Your physician recommends that you continue on your current medications as directed. Please refer to the Current Medication list given to you today.  If you need a refill on your cardiac medications before your next appointment, please call your pharmacy.   Lab work: NONE If you have labs (blood work) drawn today and your tests are completely normal, you will receive your results only by: Marland Kitchen MyChart Message (if you have MyChart) OR . A paper copy in the mail If you have any lab test that is abnormal or we need to change your treatment, we will call you to review the results.  Testing/Procedures: Your physician has requested that you have a lexiscan myoview. For further information please visit [email or web address]. Please follow instruction sheet, as given.  [place]  Your caregiver has ordered a Stress Test with nuclear imaging. The purpose of this test is to evaluate the blood supply to your heart muscle. This procedure is referred to as a "Non-Invasive Stress Test." This is because other than having an IV started in your vein, nothing is inserted or "invades" your body. Cardiac stress tests are done to find areas of poor blood flow to the heart by determining the extent of coronary artery disease (CAD). Some patients exercise on a treadmill, which naturally increases the blood flow to your heart, while others who are  unable to walk on a treadmill due to physical limitations have a pharmacologic/chemical stress agent called Lexiscan . This medicine will mimic walking on a treadmill by temporarily increasing your coronary blood flow.   Please note: these test may take anywhere between 2-4 hours to complete  PLEASE REPORT TO [place] AT THE FIRST DESK WILL DIRECT YOU WHERE TO GO  Date of Procedure:_____________________________________  Arrival Time for Procedure:______________________________   PLEASE NOTIFY THE OFFICE AT  LEAST 24 HOURS IN ADVANCE IF YOU ARE UNABLE TO KEEP YOUR APPOINTMENT.  [phone] AND  PLEASE NOTIFY NUCLEAR MEDICINE AT [facility] AT LEAST 24 HOURS IN ADVANCE IF YOU ARE UNABLE TO KEEP YOUR APPOINTMENT. [phone]  How to prepare for your Myoview test:  1. Do not eat or drink after midnight 2. No caffeine for 24 hours prior to test 3. No smoking 24 hours prior to test. 4. Your medication may be taken with water.  If your doctor stopped a medication because of this test, do not take that medication. 5. Ladies, please do not wear dresses.  Skirts or pants are appropriate. Please wear a short sleeve shirt. 6. No perfume, cologne or lotion. 7. Wear comfortable walking shoes. No heels!    Follow-Up: At [facility], you and your health needs are our priority.  As part of our continuing mission to provide you with exceptional heart care, we have created designated Provider Care Teams.  These Care Teams include your primary Cardiologist (physician) and Advanced Practice Providers (APPs -  Physician Assistants and Nurse Practitioners) who all work together to provide you with the care you need, when you need it. You will need a follow up appointment in 3 months.   You may see DR Harrell Gave END or one of the following Advanced Practice Providers on your designated Care Team:   Murray Hodgkins, NP Christell Faith, PA-C . Marrianne Mood, PA-C    Cardiac Nuclear Scan A cardiac nuclear scan is a test that measures blood flow to the heart when a person is resting and when he or she is exercising. The test looks for  problems such as:  Not enough blood reaching a portion of the heart.  The heart muscle not working normally. You may need this test if:  You have heart disease.  You have had abnormal lab results.  You have had heart surgery or a balloon procedure to open up blocked arteries (angioplasty).  You have chest pain.  You have shortness of breath. In this test, a radioactive dye  (tracer) is injected into your bloodstream. After the tracer has traveled to your heart, an imaging device is used to measure how much of the tracer is absorbed by or distributed to various areas of your heart. This procedure is usually done at a hospital and takes 2-4 hours. Tell a health care provider about:  Any allergies you have.  All medicines you are taking, including vitamins, herbs, eye drops, creams, and over-the-counter medicines.  Any problems you or family members have had with anesthetic medicines.  Any blood disorders you have.  Any surgeries you have had.  Any medical conditions you have.  Whether you are pregnant or may be pregnant. What are the risks? Generally, this is a safe procedure. However, problems may occur, including:  Serious chest pain and heart attack. This is only a risk if the stress portion of the test is done.  Rapid heartbeat.  Sensation of warmth in your chest. This usually passes quickly.  Allergic reaction to the tracer. What happens before the procedure?  Ask your health care provider about changing or stopping your regular medicines. This is especially important if you are taking diabetes medicines or blood thinners.  Follow instructions from your health care provider about eating or drinking restrictions.  Remove your jewelry on the day of the procedure. What happens during the procedure?  An IV will be inserted into one of your veins.  Your health care provider will inject a small amount of radioactive tracer through the IV.  You will wait for 20-40 minutes while the tracer travels through your bloodstream.  Your heart activity will be monitored with an electrocardiogram (ECG).  You will lie down on an exam table.  Images of your heart will be taken for about 15-20 minutes.  You may also have a stress test. For this test, one of the following may be done: ? You will exercise on a treadmill or stationary bike. While you  exercise, your heart's activity will be monitored with an ECG, and your blood pressure will be checked. ? You will be given medicines that will increase blood flow to parts of your heart. This is done if you are unable to exercise.  When blood flow to your heart has peaked, a tracer will again be injected through the IV.  After 20-40 minutes, you will get back on the exam table and have more images taken of your heart.  Depending on the type of tracer used, scans may need to be repeated 3-4 hours later.  Your IV line will be removed when the procedure is over. The procedure may vary among health care providers and hospitals. What happens after the procedure?  Unless your health care provider tells you otherwise, you may return to your normal schedule, including diet, activities, and medicines.  Unless your health care provider tells you otherwise, you may increase your fluid intake. This will help to flush the contrast dye from your body. Drink enough fluid to keep your urine pale yellow.  Ask your health care provider, or the department that is doing the test: ?  When will my results be ready? ? How will I get my results? Summary  A cardiac nuclear scan measures the blood flow to the heart when a person is resting and when he or she is exercising.  Tell your health care provider if you are pregnant.  Before the procedure, ask your health care provider about changing or stopping your regular medicines. This is especially important if you are taking diabetes medicines or blood thinners.  After the procedure, unless your health care provider tells you otherwise, increase your fluid intake. This will help flush the contrast dye from your body.  After the procedure, unless your health care provider tells you otherwise, you may return to your normal schedule, including diet, activities, and medicines. This information is not intended to replace advice given to you by your health care  provider. Make sure you discuss any questions you have with your health care provider. Document Released: 08/13/2004 Document Revised: 01/02/2018 Document Reviewed: 01/02/2018 Elsevier Patient Education  2020 [employer].

## 2019-04-30 NOTE — Progress Notes (Signed)
New Outpatient Visit Date: 04/30/2019  Referring Provider: Carol Ada, MD [address] [place],  [place] [id number]  Chief Complaint: Back and shoulder pain as well as coronary artery calcification  HPI:  Jane Price is a 63 y.o. female who is being seen today for the evaluation of coronary artery calcification and aortic atherosclerosis at the request of Dr. Tamala Julian. She has a history of hypertension, PTSD, and uterine fibroids.  She was seen in the [facility] emergency department in 08/2018 with a 2-day history of numbness in the left shoulder and subsequent development of chest pain.  CTA of the chest was negative for PE in the setting of elevated d-dimer.  Troponin was negative x2.  Unenhanced CT of the chest was performed earlier this month for follow-up of pulmonary nodules.  This was notable for scattered left coronary artery calcifications as well as aortic atherosclerosis (CT scan images personally reviewed).  Today, Jane Price reports that she continues to have intermittent left upper back and shoulder pain.  Interestingly, it seems to worsen with NSAID and Maxalt use, which she has used for headaches in the past).  She recently tried taking famotidine and feels like this improved her chest pain.  It is a little bit different than what she experienced in January, as that involved central chest discomfort as well.  It occurred after a long trip.  She notes occasional "random flutters" that lasts a few seconds at a time.  She was noted to have tachycardia many years ago and was treated with low-dose propranolol.  She reports mild exertional dyspnea but walks 35 to 40 minutes a day without difficulty.  She denies orthopnea, PND, and edema.  She reports having undergone to exercise tolerance tests, most recently in [facility] about 15 years ago.  She believes both were normal.  --------------------------------------------------------------------------------------------------   Cardiovascular History & Procedures: Cardiovascular Problems:  Atypical chest pain  Coronary artery calcification and aortic atherosclerosis  Risk Factors:  Coronary artery calcification and aortic atherosclerosis as well as hypertension  Cath/PCI:  None  CV Surgery:  None  EP Procedures and Devices:  None  Non-Invasive Evaluation(s):  Patient reports 2 prior exercise tolerance tests at least 15 years ago, which she believes were negative  Recent CV Pertinent Labs: Lab Results  Component Value Date   K 3.5 08/25/2018   BUN 9 08/25/2018   CREATININE 0.74 08/25/2018    --------------------------------------------------------------------------------------------------  Past Medical History:  Diagnosis Date  . Abdominal mass 04/2019   CT scan   . Allergic rhinitis   . Atherosclerosis of aorta ([place])   . Atherosclerosis of coronary artery   . Fibroids   . Fibroids   . Hypertension   . Lung nodules 04/2019  . Migraines   . PTSD (post-traumatic stress disorder)   . Sinus tachycardia   . Uterine fibroid     Past Surgical History:  Procedure Laterality Date  . ADENOIDECTOMY    . CESAREAN SECTION    . TONSILLECTOMY      Current Meds  Medication Sig  . albuterol (PROVENTIL HFA;VENTOLIN HFA) 108 (90 Base) MCG/ACT inhaler Inhale 1 puff into the lungs every 6 (six) hours as needed for wheezing or shortness of breath.  . fexofenadine (ALLEGRA) 180 MG tablet Take 180 mg by mouth daily.  . fluticasone (FLONASE) 50 MCG/ACT nasal spray Place 1 spray into both nostrils daily.  . Olopatadine HCl (PATADAY) 0.2 % SOLN Apply 1 drop to eye daily.    Allergies:  Erythromycin base, Other, Penicillins, and Sulfamethoxazole  Social History   Tobacco Use  . Smoking status: Never Smoker  . Smokeless tobacco: Never Used  Substance Use Topics  . Alcohol use: Never    Frequency: Never  . Drug use: Never    Family History  Problem Relation Age of Onset  . COPD Mother      Review of Systems: A 12-system review of systems was performed and was negative except as noted in the HPI.  --------------------------------------------------------------------------------------------------  Physical Exam: BP 138/68 (BP Location: Right Arm, Patient Position: Sitting, Cuff Size: Normal)   Pulse (!) 57   Temp (!) 97.2 F (36.2 C)   Ht 5\' 3"  (1.6 m)   Wt 142 lb 12 oz (64.8 kg)   SpO2 98%   BMI 25.29 kg/m   General: NAD. HEENT: No conjunctival pallor or scleral icterus.  Facemask in place. Neck: Supple without lymphadenopathy, thyromegaly, JVD, or HJR. No carotid bruit. Lungs: Normal work of breathing. Clear to auscultation bilaterally without wheezes or crackles. Heart: Regular rate and rhythm without murmurs, rubs, or gallops. Non-displaced PMI. Abd: Bowel sounds present.  Nontender/nondistended.  Firm lower abdominal mass noted consistent with uterine fibroids.  No HSM. Ext: No lower extremity edema. Radial, PT, and DP pulses are 2+ bilaterally Skin: Warm and dry without rash. Neuro: CNIII-XII intact. Strength and fine-touch sensation intact in upper and lower extremities bilaterally. Psych: Normal mood and affect.  EKG: Sinus bradycardia (heart rate 57 bpm).  No significant abnormality.  Outside labs (04/16/2019): Lipid panel: Total cholesterol 201, triglycerides 75, HDL 53, LDL 133  CMP: Sodium 139, potassium 4.2, chloride 102, CO2 30, BUN 10, creatinine 0.9, glucose 98, total calcium 9.7, AST 18, ALT 13, alkaline phosphatase 66, total bilirubin 0.3, total protein 7.0, albumin 4.5  --------------------------------------------------------------------------------------------------  ASSESSMENT AND PLAN: Atypical chest pain and dyspnea on exertion the setting of coronary artery calcification and aortic atherosclerosis: Constellation of symptoms is not consistent with angina.  However, the patient was noted to have mild coronary artery calcification on recent  CT of the chest.  Only other cardiac risk factor is hypertension.  However, given continued intermittent nature of her symptoms, we have agreed to perform a functional study; we will perform a pharmacologic myocardial perfusion stress test at the patient's convenience.  In the meantime, I think it is reasonable to continue with famotidine use.  I would targeting an LDL less than 100 (ideally less than 70) in the setting of aortic atherosclerosis and coronary artery calcification.  I will defer patient of statin therapy to Dr. Tamala Julian.  If the stress test is not high risk, I think it would be reasonable for Jane Price to proceed with surgical management of her uterine fibroids.  Hypertension: Blood pressure upper normal today.  No medication changes today.  Follow-up: Return to clinic in 3 months.  Nelva Bush, MD 05/01/2019 9:40 PM

## 2019-05-01 ENCOUNTER — Encounter: Payer: Self-pay | Admitting: Internal Medicine

## 2019-05-01 ENCOUNTER — Telehealth: Payer: Self-pay | Admitting: Internal Medicine

## 2019-05-01 DIAGNOSIS — R0789 Other chest pain: Secondary | ICD-10-CM

## 2019-05-01 NOTE — Telephone Encounter (Signed)
LMOV to schedule lexi and 3 m fu per checkout 04/30/19 [facility]

## 2019-05-16 ENCOUNTER — Telehealth: Payer: Self-pay | Admitting: *Deleted

## 2019-05-16 NOTE — Telephone Encounter (Signed)
Called and left the patient a message to call the office back. Need to schedule a new patient appt  

## 2019-05-17 ENCOUNTER — Telehealth: Payer: Self-pay | Admitting: *Deleted

## 2019-05-17 NOTE — Telephone Encounter (Signed)
Called and left the patient a message to call the office back. Patient needs to be scheduled for a new appt

## 2019-05-18 ENCOUNTER — Telehealth: Payer: Self-pay | Admitting: *Deleted

## 2019-05-18 NOTE — Telephone Encounter (Signed)
Called and left the patient a message to call the office back. Patient needs to be scheduled for a new patient appt  °

## 2019-05-21 ENCOUNTER — Telehealth: Payer: Self-pay | Admitting: *Deleted

## 2019-05-21 NOTE — Telephone Encounter (Signed)
appt

## 2019-05-21 NOTE — Telephone Encounter (Signed)
Called and spoke with the patient, scheduled appt for 11/4. Gave date/time, address and policy for mask/visitors

## 2019-06-06 ENCOUNTER — Telehealth: Payer: Self-pay | Admitting: *Deleted

## 2019-06-06 ENCOUNTER — Other Ambulatory Visit: Payer: Self-pay

## 2019-06-06 ENCOUNTER — Encounter: Payer: Self-pay | Admitting: Gynecologic Oncology

## 2019-06-06 ENCOUNTER — Inpatient Hospital Stay: Payer: 59 | Attending: Gynecologic Oncology | Admitting: Gynecologic Oncology

## 2019-06-06 ENCOUNTER — Inpatient Hospital Stay: Payer: 59

## 2019-06-06 VITALS — BP 163/84 | HR 69 | Temp 98.5°F | Resp 17 | Ht 63.0 in | Wt 143.5 lb

## 2019-06-06 DIAGNOSIS — I2510 Atherosclerotic heart disease of native coronary artery without angina pectoris: Secondary | ICD-10-CM | POA: Diagnosis not present

## 2019-06-06 DIAGNOSIS — F4310 Post-traumatic stress disorder, unspecified: Secondary | ICD-10-CM | POA: Insufficient documentation

## 2019-06-06 DIAGNOSIS — R000 Tachycardia, unspecified: Secondary | ICD-10-CM | POA: Insufficient documentation

## 2019-06-06 DIAGNOSIS — R1900 Intra-abdominal and pelvic swelling, mass and lump, unspecified site: Secondary | ICD-10-CM

## 2019-06-06 DIAGNOSIS — D250 Submucous leiomyoma of uterus: Secondary | ICD-10-CM

## 2019-06-06 DIAGNOSIS — I10 Essential (primary) hypertension: Secondary | ICD-10-CM | POA: Diagnosis not present

## 2019-06-06 DIAGNOSIS — Z780 Asymptomatic menopausal state: Secondary | ICD-10-CM | POA: Diagnosis not present

## 2019-06-06 DIAGNOSIS — D259 Leiomyoma of uterus, unspecified: Secondary | ICD-10-CM

## 2019-06-06 DIAGNOSIS — Z79899 Other long term (current) drug therapy: Secondary | ICD-10-CM | POA: Insufficient documentation

## 2019-06-06 DIAGNOSIS — Z7982 Long term (current) use of aspirin: Secondary | ICD-10-CM | POA: Insufficient documentation

## 2019-06-06 DIAGNOSIS — J309 Allergic rhinitis, unspecified: Secondary | ICD-10-CM | POA: Diagnosis not present

## 2019-06-06 DIAGNOSIS — D252 Subserosal leiomyoma of uterus: Secondary | ICD-10-CM

## 2019-06-06 DIAGNOSIS — I7090 Unspecified atherosclerosis: Secondary | ICD-10-CM | POA: Diagnosis not present

## 2019-06-06 LAB — BASIC METABOLIC PANEL
Anion gap: 11 (ref 5–15)
BUN: 8 mg/dL (ref 8–23)
CO2: 25 mmol/L (ref 22–32)
Calcium: 9.2 mg/dL (ref 8.9–10.3)
Chloride: 106 mmol/L (ref 98–111)
Creatinine, Ser: 0.85 mg/dL (ref 0.44–1.00)
GFR calc Af Amer: >60 mL/min (ref >60)
GFR calc non Af Amer: >60 mL/min (ref >60)
Glucose, Bld: 101 mg/dL — ABNORMAL HIGH (ref 70–99)
Potassium: 4.1 mmol/L (ref 3.5–5.1)
Sodium: 142 mmol/L (ref 135–145)

## 2019-06-06 NOTE — Patient Instructions (Signed)
Dr Denman George is recommending that you continue your plans for cardiac optimization to prepare for surgery.  She will see you again in mid January (or later at your discretion) to discuss surgical planning,  She is recommending a robotic assisted laparoscopic total hysterectomy with removal of both fallopian tubes. She will leave the ovaries if normal appearing.The surgery may need to be converted to a large incision (laparotomy) if small incisions are not successful.   She will obtain an MRI of the pelvis and abdomen for surgical planning.  Her office can be reached at [phone].

## 2019-06-07 ENCOUNTER — Telehealth: Payer: Self-pay

## 2019-06-07 ENCOUNTER — Encounter: Payer: Self-pay | Admitting: Gynecologic Oncology

## 2019-06-07 NOTE — Progress Notes (Signed)
Consult Note: Gyn-Onc  Consult was requested by Dr. Landry Mellow for the evaluation of Jane Price 63 y.o. female  CC:  Chief Complaint  Patient presents with  . Pelvic mass  . Fibroids    Assessment/Plan:  Jane. Jane Price  is a 63 y.o.  year old with a large (30cm) fibroid uterus.   She is interested in surgery (definitive surgery with hysterectomy and bilateral salpingectomy).  She desires to retain her ovaries.  We will assess her uterus with an MRI to better characterize the size and location of the fibroids make surgical planning.  Based on my examination today I feel that she is a reasonable candidate for a robotically assisted total hysterectomy for uterus greater than 250 g with bilateral salpingectomy.  I discussed the potential risks of the surgery including bleeding, hemorrhage, blood transfusion, DVT or PE, deep pelvic or skin infection, damage to adjacent structures.  I explained that risks are higher when operating on bulky large uteri.  I explained that if we perform a hysterectomy minimally invasively this will be associated with decreased blood loss and perioperative complications.  She would require mini laparotomy for specimen delivery.  We would remove her fibroid in a contained fashion.  There would not be uncontained morcellation within the peritoneal cavity.  She desires ovary retention.  I discussed the 1 in 10 risk for reoperation necessary for development of adnexal pathology in the future.  She has some cardiac symptoms and history of coronary or cardiovascular disease and is having further work-up with Dr. And in [place].  She will have a stress test likely in January and will then return to see me following that at which time we will finalize surgical plans.   HPI: Jane Price is a 63 year old P1 who is seen in consultation at the request of Dr Landry Mellow for a large fibroid uterus.   The patient has had a 30 cm fibroid uterus for several years, possibly 8 years.   She desires definitive management for this as it is associated with some bulk symptoms and she perceives that it increases and decreases in size based on her diet.  The patient began having some work-up for chest pain in the summer 2020 this included a CT scan of the chest which was negative for PE but did capture the upper aspect of an abdominopelvic mass.  As part of her chest pain work-up she was seen by Dr. And who recommended a stress test.  She did not desire the perfusion stress test and instead hope to optimize her ambulatory skills in order to undergo a exercise stress test.  She is planning to reinitiate this in January 2021.  To follow-up the development of the abdominopelvic mass and the possible subtle increase in size she followed up with Dr. Landry Mellow.  Dr. Landry Mellow performed an ultrasound scan on May 15, 2019 which revealed a pelvic mass seen well above the umbilicus, unable to measure due to size, unable to completely visualize the entire mass.  It approximately measures 30 x 23.3 x 14.8 cm.  A Pap test was performed on 05/03/2017 which had been normal with negative vaginitis screen.  Her personal history is significant for 1 prior cesarean section and tonsillectomy.  She is treated for hypoglycemia, PTSD, migraines, and coronary artery disease with atherosclerosis seen on CT scan of the chest in September 2020.  Her GYN history is significant for known fibroid uterus.  She underwent menopause at age 82 and had no postmenopausal bleeding.  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  Her family history is significant for 2 sisters who had breast cancer diagnosis in the postmenopausal years.  Current Meds:  Outpatient Encounter Medications as of 06/06/2019  Medication Sig  . albuterol (PROVENTIL HFA;VENTOLIN HFA) 108 (90 Base) MCG/ACT inhaler Inhale 1 puff into the lungs every 6 (six) hours as needed for wheezing or shortness of breath.  Marland Kitchen aspirin 325 MG tablet Take  325 mg by mouth as needed for headache.   . fexofenadine (ALLEGRA) 180 MG tablet Take 180 mg by mouth daily.  . fluticasone (FLONASE) 50 MCG/ACT nasal spray Place 1 spray into both nostrils daily.  . Olopatadine HCl (PATADAY) 0.2 % SOLN Apply 1 drop to eye daily.  . rizatriptan (MAXALT-MLT) 10 MG disintegrating tablet Take 10 mg by mouth as needed.    No facility-administered encounter medications on file as of 06/06/2019.     Allergy:  Allergies  Allergen Reactions  . Erythromycin Base Other (See Comments)  . Other Other (See Comments)  . Ciprofloxacin     Fever  . Doxycycline     headache  . Metronidazole     bruising  . Nasacort [Triamcinolone]   . Tobramycin     headache  . Penicillins Rash  . Sulfamethoxazole Rash    Social Hx:   Social History   Socioeconomic History  . Marital status: Married    Spouse name: Not on file  . Number of children: Not on file  . Years of education: Not on file  . Highest education level: Not on file  Occupational History  . Not on file  Social Needs  . Financial resource strain: Not on file  . Food insecurity    Worry: Not on file    Inability: Not on file  . Transportation needs    Medical: Not on file    Non-medical: Not on file  Tobacco Use  . Smoking status: Never Smoker  . Smokeless tobacco: Never Used  Substance and Sexual Activity  . Alcohol use: Never    Frequency: Never  . Drug use: Never  . Sexual activity: Not on file  Lifestyle  . Physical activity    Days per week: Not on file    Minutes per session: Not on file  . Stress: Not on file  Relationships  . Social Herbalist on phone: Not on file    Gets together: Not on file    Attends religious service: Not on file    Active member of club or organization: Not on file    Attends meetings of clubs or organizations: Not on file    Relationship status: Not on file  . Intimate partner violence    Fear of current or ex partner: Not on file     Emotionally abused: Not on file    Physically abused: Not on file    Forced sexual activity: Not on file  Other Topics Concern  . Not on file  Social History Narrative  . Not on file    Past Surgical Hx:  Past Surgical History:  Procedure Laterality Date  . ADENOIDECTOMY    . CESAREAN SECTION    . TONSILLECTOMY      Past Medical Hx:  Past Medical History:  Diagnosis Date  . Abdominal mass 04/2019   CT scan   . Allergic rhinitis   . Atherosclerosis of aorta ([place])   . Atherosclerosis of coronary artery   . Fibroids   .  Fibroids   . Hypertension   . Lung nodules 04/2019  . Migraines   . PTSD (post-traumatic stress disorder)   . Sinus tachycardia   . Uterine fibroid     Past Gynecological History:  See HPI No LMP recorded. Patient is postmenopausal.  Family Hx:  Family History  Problem Relation Age of Onset  . COPD Mother   . Cancer Sister        Breast Cancer    Review of Systems:  Constitutional  Feels well,   ENT Normal appearing ears and nares bilaterally Skin/Breast  No rash, sores, jaundice, itching, dryness Cardiovascular  No chest pain, shortness of breath, or edema  Pulmonary  No cough or wheeze.  Gastro Intestinal  No nausea, vomitting, or diarrhoea. No bright red blood per rectum, no abdominal pain, change in bowel movement, or constipation.  Genito Urinary  No frequency, urgency, dysuria, no bleeding Musculo Skeletal  No myalgia, arthralgia, joint swelling or pain  Neurologic  No weakness, numbness, change in gait,  Psychology  No depression, anxiety, insomnia.   Vitals:  Blood pressure (!) 163/84, pulse 69, temperature 98.5 F (36.9 C), temperature source Temporal, resp. rate 17, height 5\' 3"  (1.6 m), weight 143 lb 8 oz (65.1 kg), SpO2 100 %.  Physical Exam: WD in NAD Neck  Supple NROM, without any enlargements.  Lymph Node Survey No cervical supraclavicular or inguinal adenopathy Cardiovascular  Pulse normal rate, regularity and  rhythm. S1 and S2 normal.  Lungs  Clear to auscultation bilateraly, without wheezes/crackles/rhonchi. Good air movement.  Skin  No rash/lesions/breakdown  Psychiatry  Alert and oriented to person, place, and time  Abdomen  Normoactive bowel sounds, abdomen soft, non-tender and thin without evidence of hernia. There is a large abdominopelvic mass filling the pelvis and abdomen extending to a handspan below xiphoid. Back No CVA tenderness Genito Urinary  Vulva/vagina: Normal external female genitalia.  No lesions. No discharge or bleeding.  Bladder/urethra:  No lesions or masses, well supported bladder  Vagina: normal  Cervix: Normal appearing, no lesions.  Uterus: 30+cm, mobile, no parametrial involvement or nodularity. Narrow lower uterine segment.  Adnexa: no discrete other masses. Rectal  deferred Extremities  No bilateral cyanosis, clubbing or edema.   Thereasa Solo, MD  06/07/2019, 4:01 PM

## 2019-06-07 NOTE — Telephone Encounter (Signed)
I let Jane Price know that her BMP results were normal.  She verbalized understanding.

## 2019-06-14 ENCOUNTER — Ambulatory Visit (HOSPITAL_COMMUNITY): Payer: 59

## 2019-06-14 ENCOUNTER — Ambulatory Visit (HOSPITAL_COMMUNITY): Admission: RE | Admit: 2019-06-14 | Payer: 59 | Source: Ambulatory Visit

## 2019-06-18 ENCOUNTER — Encounter (HOSPITAL_COMMUNITY): Payer: Self-pay

## 2019-06-18 ENCOUNTER — Ambulatory Visit (HOSPITAL_COMMUNITY)
Admission: RE | Admit: 2019-06-18 | Discharge: 2019-06-18 | Disposition: A | Discharge status: Home / Self Care | Payer: 59 | Source: Ambulatory Visit | Attending: Gynecologic Oncology | Admitting: Gynecologic Oncology

## 2019-06-18 ENCOUNTER — Other Ambulatory Visit: Payer: Self-pay

## 2019-06-18 DIAGNOSIS — D250 Submucous leiomyoma of uterus: Secondary | ICD-10-CM

## 2019-06-18 DIAGNOSIS — D252 Subserosal leiomyoma of uterus: Secondary | ICD-10-CM | POA: Diagnosis present

## 2019-06-18 DIAGNOSIS — D251 Intramural leiomyoma of uterus: Secondary | ICD-10-CM | POA: Insufficient documentation

## 2019-06-18 MED ORDER — GADOBUTROL 1 MMOL/ML IV SOLN
7.5000 mL | Freq: Once | INTRAVENOUS | Status: AC | PRN
  Administered 2019-06-18: 7.5 mL via INTRAVENOUS

## 2019-07-17 NOTE — Telephone Encounter (Signed)
Attempted to schedule.  Patient not ready and wants a call back in January.

## 2019-08-29 NOTE — Telephone Encounter (Signed)
L mom to schedule appointment

## 2019-09-14 NOTE — Telephone Encounter (Signed)
Patient wants to wait a few months.

## 2019-09-24 NOTE — Telephone Encounter (Signed)
No answer, Left message for patient

## 2019-09-25 ENCOUNTER — Telehealth: Payer: Self-pay | Admitting: Internal Medicine

## 2019-09-25 NOTE — Telephone Encounter (Signed)
Patient has been contacted at least 3 times for a recall, recall has been deleted

## 2019-11-27 ENCOUNTER — Telehealth: Payer: Self-pay | Admitting: *Deleted

## 2019-11-27 NOTE — Telephone Encounter (Signed)
Patient called back and moved her appt to the morning

## 2019-11-27 NOTE — Telephone Encounter (Signed)
Called and left a message for the patient to call the office back to move her appt

## 2019-11-27 NOTE — Progress Notes (Signed)
Follow-up Note: Gyn-Onc  Consult was requested by Dr. Landry Mellow for the evaluation of Jane Price 64 y.o. female  CC:  Chief Complaint  Patient presents with  . Fibroids    Follow up    Assessment/Plan:  Ms. Jane Price  is a 64 y.o.  year old with a large (30cm) fibroid uterus and ventral hernia.   She is interested in surgery (definitive surgery with hysterectomy and bilateral salpingectomy) in June, 2021.  She desires to retain her ovaries.  I feel that she is a reasonable candidate for a robotically assisted total hysterectomy for uterus greater than 250 g with bilateral salpingectomy with minilaparotomy for specimen delivery and ventral hernia repair. We will likely use her vertical midline incision for specimen delivery in order to repair the hernia at the same time.  I discussed the potential risks of the surgery including bleeding, hemorrhage, blood transfusion, DVT or PE, deep pelvic or skin infection, damage to adjacent structures.  I explained that risks are higher when operating on bulky large uteri.  I explained that if we perform a hysterectomy minimally invasively this will be associated with decreased blood loss and perioperative complications.  She would require mini laparotomy for specimen delivery.  We would remove her fibroid in a contained fashion.  There would not be uncontained morcellation within the peritoneal cavity.  She will obtain a stress test with Dr End in [place] and then planned surgery (if stress test normal) in June, 2021.   HPI: Ms Jane Price is a 64 year old P1 who is seen in consultation at the request of Dr Landry Mellow for a large fibroid uterus.   The patient has had a 30 cm fibroid uterus for several years, possibly 8 years.  She desires definitive management for this as it is associated with some bulk symptoms and she perceives that it increases and decreases in size based on her diet.  The patient began having some work-up for chest pain in the  summer 2020 this included a CT scan of the chest which was negative for PE but did capture the upper aspect of an abdominopelvic mass.  As part of her chest pain work-up she was seen by Dr. And who recommended a stress test.  She did not desire the perfusion stress test and instead hope to optimize her ambulatory skills in order to undergo a exercise stress test.  She is planning to reinitiate this in January 2021.  To follow-up the development of the abdominopelvic mass and the possible subtle increase in size she followed up with Dr. Landry Mellow.  Dr. Landry Mellow performed an ultrasound scan on May 15, 2019 which revealed a pelvic mass seen well above the umbilicus, unable to measure due to size, unable to completely visualize the entire mass.  It approximately measures 30 x 23.3 x 14.8 cm.  A Pap test was performed on 05/03/2017 which had been normal with negative vaginitis screen.  Her personal history is significant for 1 prior cesarean section and tonsillectomy.  She is treated for hypoglycemia, PTSD, migraines, and coronary artery disease with atherosclerosis seen on CT scan of the chest in September 2020.  Her GYN history is significant for known fibroid uterus.  She underwent menopause at age 16 and had no postmenopausal bleeding.  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  Her family history is significant for 2 sisters who had breast cancer diagnosis in the postmenopausal years.  Interval Hx:  She was offered surgery in November, 2020 but declined  at that time.  MRI from November, 2020 showed a uterus measuring 27.0 x 14.9 by 18.7 cm (volume = 3940 cm^3). A dominant large intramural fibroid is seen occupying nearly the entire uterine corpus and fundus which measures 22.6 x 14.9 x 18.7cm. The enlarged uterus extends into the mid abdomen, just to the left of midline. A 1.2 cm intramural fibroid is also seen in the right lateral lower uterine segment. There was a 2.2cm left  ovarian cyst. There was left hydroureter from compression.   She has not yet had her stress test for cardiac workup of chest pain with Dr End in [place] which was scheduled for January, 2021 because she was concerned regarding exposure to [place] during that time. She is now vaccinated and open to proceeding with this in addition to surgery.   She desires definitive surgery for her fibroid uterus in June, 2021 after she has moved house.   Current Meds:  Outpatient Encounter Medications as of 11/28/2019  Medication Sig  . albuterol (PROVENTIL HFA;VENTOLIN HFA) 108 (90 Base) MCG/ACT inhaler Inhale 1 puff into the lungs every 6 (six) hours as needed for wheezing or shortness of breath.  Marland Kitchen aspirin 325 MG tablet Take 325 mg by mouth as needed for headache.   . fexofenadine (ALLEGRA) 180 MG tablet Take 180 mg by mouth daily.  . fluticasone (FLONASE) 50 MCG/ACT nasal spray Place 1 spray into both nostrils daily.  . Olopatadine HCl (PATADAY) 0.2 % SOLN Apply 1 drop to eye daily.  . rizatriptan (MAXALT-MLT) 10 MG disintegrating tablet Take 10 mg by mouth as needed.    No facility-administered encounter medications on file as of 11/28/2019.    Allergy:  Allergies  Allergen Reactions  . Erythromycin Base Other (See Comments)  . Other Other (See Comments)  . Ciprofloxacin     Fever  . Doxycycline     headache  . Metronidazole     bruising  . Nasacort [Triamcinolone]   . Tobramycin     headache  . Penicillins Rash  . Sulfamethoxazole Rash    Social Hx:   Social History   Socioeconomic History  . Marital status: Married    Spouse name: Not on file  . Number of children: Not on file  . Years of education: Not on file  . Highest education level: Not on file  Occupational History  . Not on file  Tobacco Use  . Smoking status: Never Smoker  . Smokeless tobacco: Never Used  Substance and Sexual Activity  . Alcohol use: Never  . Drug use: Never  . Sexual activity: Not on file  Other  Topics Concern  . Not on file  Social History Narrative  . Not on file   Social Determinants of Health   Financial Resource Strain:   . Difficulty of Paying Living Expenses:   Food Insecurity:   . Worried About Charity fundraiser in the Last Year:   . Arboriculturist in the Last Year:   Transportation Needs:   . Film/video editor (Medical):   Marland Kitchen Lack of Transportation (Non-Medical):   Physical Activity:   . Days of Exercise per Week:   . Minutes of Exercise per Session:   Stress:   . Feeling of Stress :   Social Connections:   . Frequency of Communication with Friends and Family:   . Frequency of Social Gatherings with Friends and Family:   . Attends Religious Services:   . Active Member of Clubs or  Organizations:   . Attends Archivist Meetings:   Marland Kitchen Marital Status:   Intimate Partner Violence:   . Fear of Current or Ex-Partner:   . Emotionally Abused:   Marland Kitchen Physically Abused:   . Sexually Abused:     Past Surgical Hx:  Past Surgical History:  Procedure Laterality Date  . ADENOIDECTOMY    . CESAREAN SECTION    . TONSILLECTOMY      Past Medical Hx:  Past Medical History:  Diagnosis Date  . Abdominal mass 04/2019   CT scan   . Allergic rhinitis   . Atherosclerosis of aorta ([place])   . Atherosclerosis of coronary artery   . Fibroids   . Fibroids   . Hypertension   . Lung nodules 04/2019  . Migraines   . PTSD (post-traumatic stress disorder)   . Sinus tachycardia   . Uterine fibroid     Past Gynecological History:  See HPI No LMP recorded. Patient is postmenopausal.  Family Hx:  Family History  Problem Relation Age of Onset  . COPD Mother   . Cancer Sister        Breast Cancer    Review of Systems:  Constitutional  Feels well,   ENT Normal appearing ears and nares bilaterally Skin/Breast  No rash, sores, jaundice, itching, dryness Cardiovascular  No chest pain, shortness of breath, or edema  Pulmonary  No cough or wheeze.   Gastro Intestinal  No nausea, vomitting, or diarrhoea. No bright red blood per rectum, no abdominal pain, change in bowel movement, or constipation.  Genito Urinary  No frequency, urgency, dysuria, no bleeding Musculo Skeletal  No myalgia, arthralgia, joint swelling or pain  Neurologic  No weakness, numbness, change in gait,  Psychology  No depression, anxiety, insomnia.   Vitals:  Blood pressure 134/77, pulse 67, temperature 98.9 F (37.2 C), temperature source Temporal, height 5\' 3"  (1.6 m), weight 147 lb 2 oz (66.7 kg), SpO2 99 %.  Physical Exam: WD in NAD Neck  Supple NROM, without any enlargements.  Lymph Node Survey No cervical supraclavicular or inguinal adenopathy Cardiovascular  Pulse normal rate, regularity and rhythm. S1 and S2 normal.  Lungs  Clear to auscultation bilateraly, without wheezes/crackles/rhonchi. Good air movement.  Skin  No rash/lesions/breakdown  Psychiatry  Alert and oriented to person, place, and time  Abdomen  Normoactive bowel sounds, abdomen soft, non-tender and thin with soft, reducible, 5cm ventral hernia at the superior aspect of her infraumbilical vertical midline incision. There is a large abdominopelvic mass filling the pelvis and abdomen extending to a handspan below xiphoid. Back No CVA tenderness Genito Urinary  Vulva/vagina: Normal external female genitalia.  No lesions. No discharge or bleeding.  Bladder/urethra:  No lesions or masses, well supported bladder  Vagina: normal  Cervix: Normal appearing, no lesions.  Uterus: 30+cm, mobile, no parametrial involvement or nodularity. Narrow lower uterine segment.  Adnexa: no discrete other masses. Rectal  deferred Extremities  No bilateral cyanosis, clubbing or edema.   Thereasa Solo, MD  11/28/2019, 11:58 AM

## 2019-11-28 ENCOUNTER — Inpatient Hospital Stay: Payer: BC Managed Care – PPO | Attending: Gynecologic Oncology | Admitting: Gynecologic Oncology

## 2019-11-28 ENCOUNTER — Other Ambulatory Visit: Payer: Self-pay

## 2019-11-28 ENCOUNTER — Encounter: Payer: Self-pay | Admitting: Gynecologic Oncology

## 2019-11-28 VITALS — BP 134/77 | HR 67 | Temp 98.9°F | Ht 63.0 in | Wt 147.1 lb

## 2019-11-28 DIAGNOSIS — D250 Submucous leiomyoma of uterus: Secondary | ICD-10-CM | POA: Diagnosis not present

## 2019-11-28 DIAGNOSIS — D251 Intramural leiomyoma of uterus: Secondary | ICD-10-CM | POA: Insufficient documentation

## 2019-11-28 DIAGNOSIS — K429 Umbilical hernia without obstruction or gangrene: Secondary | ICD-10-CM

## 2019-11-28 DIAGNOSIS — K439 Ventral hernia without obstruction or gangrene: Secondary | ICD-10-CM | POA: Diagnosis not present

## 2019-11-28 DIAGNOSIS — D252 Subserosal leiomyoma of uterus: Secondary | ICD-10-CM | POA: Insufficient documentation

## 2019-11-28 DIAGNOSIS — R1900 Intra-abdominal and pelvic swelling, mass and lump, unspecified site: Secondary | ICD-10-CM

## 2019-11-28 NOTE — Patient Instructions (Signed)
Preparing for your Surgery  Plan for surgery on January 10, 2020 with Dr. Everitt Amber at [facility] will be scheduled for a robotic assisted total laparoscopic hysterectomy, bilateral salpingectomy, mini laparotomy, hernia repair.   You will need to have your stress test prior to surgery.  STOP TAKING YOUR ASPIRIN FOR AT LEAST ONE MONTH BEFORE SURGERY.  Pre-operative Testing -You will receive a phone call from presurgical testing at [facility] to arrange for a pre-operative appointment over the phone, lab appointment, and COVID test. The COVID test normally happens 3 days prior to the surgery and they ask that you self quarantine after the test up until surgery to decrease chance of exposure.  -Bring your insurance card, copy of an advanced directive if applicable, medication list  -At that visit, you will be asked to sign a consent for a possible blood transfusion in case a transfusion becomes necessary during surgery.  The need for a blood transfusion is rare but having consent is a necessary part of your care.     -Do not take supplements such as fish oil (omega 3), red yeast rice, turmeric before your surgery.   Day Before Surgery at [place] will be asked to take in a light diet the day before surgery.  Avoid carbonated beverages.  You will be advised you can have clear liquids after midnight and up until 3 hours before your surgery.    Eat a light diet the day before surgery.  Examples including soups, broths, toast, yogurt, mashed potatoes.  Things to avoid include carbonated beverages (fizzy beverages), raw fruits and raw vegetables, or beans.   If your bowels are filled with gas, your surgeon will have difficulty visualizing your pelvic organs which increases your surgical risks.  Your role in recovery Your role is to become active as soon as directed by your doctor, while still giving yourself time to heal.  Rest when you feel tired. You will be asked to do  the following in order to speed your recovery:  - Cough and breathe deeply. This helps to clear and expand your lungs and can prevent pneumonia after surgery.  - [place]. Do mild physical activity. Walking or moving your legs help your circulation and body functions return to normal. Do not try to get up or walk alone the first time after surgery.   -If you develop swelling on one leg or the other, pain in the back of your leg, redness/warmth in one of your legs, please call the office or go to the Emergency Room to have a doppler to rule out a blood clot. For shortness of breath, chest pain-seek care in the Emergency Room as soon as possible. - Actively manage your pain. Managing your pain lets you move in comfort. We will ask you to rate your pain on a scale of zero to 10. It is your responsibility to tell your doctor or nurse where and how much you hurt so your pain can be treated.  Special Considerations -If you are diabetic, you may be placed on insulin after surgery to have closer control over your blood sugars to promote healing and recovery.  This does not mean that you will be discharged on insulin.  If applicable, your oral antidiabetics will be resumed when you are tolerating a solid diet.  -Your final pathology results from surgery should be available around one week after surgery and the results will be relayed to you when  available.  -Dr. Lahoma Crocker is the surgeon that assists your GYN Oncologist with surgery.  If you end up staying the night, the next day after your surgery you will either see Dr. Denman George, Dr. Berline Lopes, or Dr. Lahoma Crocker.  -FMLA forms can be faxed to [phone] and please allow 5-7 business days for completion.  Pain Management After Surgery -You will be prescribed your pain medication and bowel regimen medications before surgery so that you can have these available when you are discharged from the hospital. The pain medication is  for use ONLY AFTER surgery and a new prescription will not be given.   -Make sure that you have Tylenol and Ibuprofen at home to use on a regular basis after surgery for pain control. We recommend alternating the medications every hour to six hours since they work differently and are processed in the body differently for pain relief.  -Review the attached handout on narcotic use and their risks and side effects.   Bowel Regimen -You will be prescribed Sennakot-S to take nightly to prevent constipation especially if you are taking the narcotic pain medication intermittently.  It is important to prevent constipation and drink adequate amounts of liquids. You can stop taking this medication when you are not taking pain medication and you are back on your normal bowel routine.  Risks of Surgery Risks of surgery are low but include bleeding, infection, damage to surrounding structures, re-operation, blood clots, and very rarely death.   Blood Transfusion Information (For the consent to be signed before surgery)  We will be checking your blood type before surgery so in case of emergencies, we will know what type of blood you would need.                                            WHAT IS A BLOOD TRANSFUSION?  A transfusion is the replacement of blood or some of its parts. Blood is made up of multiple cells which provide different functions.  Red blood cells carry oxygen and are used for blood loss replacement.  White blood cells fight against infection.  Platelets control bleeding.  Plasma helps clot blood.  Other blood products are available for specialized needs, such as hemophilia or other clotting disorders. BEFORE THE TRANSFUSION  Who gives blood for transfusions?   You may be able to donate blood to be used at a later date on yourself (autologous donation).  Relatives can be asked to donate blood. This is generally not any safer than if you have received blood from a stranger. The  same precautions are taken to ensure safety when a relative's blood is donated.  Healthy volunteers who are fully evaluated to make sure their blood is safe. This is blood bank blood. Transfusion therapy is the safest it has ever been in the practice of medicine. Before blood is taken from a donor, a complete history is taken to make sure that person has no history of diseases nor engages in risky social behavior (examples are intravenous drug use or sexual activity with multiple partners). The donor's travel history is screened to minimize risk of transmitting infections, such as malaria. The donated blood is tested for signs of infectious diseases, such as HIV and hepatitis. The blood is then tested to be sure it is compatible with you in order to minimize the chance of a transfusion reaction. If  you or a relative donates blood, this is often done in anticipation of surgery and is not appropriate for emergency situations. It takes many days to process the donated blood. RISKS AND COMPLICATIONS Although transfusion therapy is very safe and saves many lives, the main dangers of transfusion include:   Getting an infectious disease.  Developing a transfusion reaction. This is an allergic reaction to something in the blood you were given. Every precaution is taken to prevent this. The decision to have a blood transfusion has been considered carefully by your caregiver before blood is given. Blood is not given unless the benefits outweigh the risks.  AFTER SURGERY INSTRUCTIONS  Return to work: 4-6 weeks if applicable  Activity: 1. Be up and out of the bed during the day.  Take a nap if needed.  You may walk up steps but be careful and use the hand rail.  Stair climbing will tire you more than you think, you may need to stop part way and rest.   2. No lifting or straining for 6 weeks over 10 pounds. No pushing, pulling, straining for 6 weeks.  3. No driving for 1 week(s).  Do not drive if you are  taking narcotic pain medicine and make sure that your reaction time has returned.   4. You can shower as soon as the next day after surgery. Shower daily.  Use soap and water on your incision and pat dry; don't rub.  No tub baths or submerging your body in water until cleared by your surgeon. If you have the soap that was given to you by pre-surgical testing that was used before surgery, you do not need to use it afterwards because this can irritate your incisions.   5. No sexual activity and nothing in the vagina for 8 weeks.  6. You may experience a small amount of clear drainage from your incisions, which is normal.  If the drainage persists, increases, or changes color please call the office.  7. Do not use creams, lotions, or ointments such as neosporin on your incisions after surgery until advised by your surgeon because they can cause removal of the dermabond glue on your incisions.    8. You may experience vaginal spotting after surgery or around the 6-8 week mark from surgery when the stitches at the top of the vagina begin to dissolve.  The spotting is normal but if you experience heavy bleeding, call our office.  9. Take Tylenol or ibuprofen first for pain and only use narcotic pain medication for severe pain not relieved by the Tylenol or Ibuprofen.  Monitor your Tylenol intake to a max of 4,000 mg.  Diet: 1. Low sodium Heart Healthy Diet is recommended.  2. It is safe to use a laxative, such as Miralax or Colace, if you have difficulty moving your bowels. You can take Sennakot at bedtime every evening to keep bowel movements regular and to prevent constipation.    Wound Care: 1. Keep clean and dry.  Shower daily.  Reasons to call the Doctor:  Fever - Oral temperature greater than 100.4 degrees Fahrenheit  Foul-smelling vaginal discharge  Difficulty urinating  Nausea and vomiting  Increased pain at the site of the incision that is unrelieved with pain  medicine.  Difficulty breathing with or without chest pain  New calf pain especially if only on one side  Sudden, continuing increased vaginal bleeding with or without clots.   Contacts: For questions or concerns you should contact:  Dr. Terrence Dupont  Denman George at [phone]  Joylene John, NP at [phone]  After Hours: call [phone] and have the GYN Oncologist paged/contacted

## 2019-11-29 ENCOUNTER — Encounter: Payer: Self-pay | Admitting: Gynecologic Oncology

## 2019-11-29 DIAGNOSIS — K439 Ventral hernia without obstruction or gangrene: Secondary | ICD-10-CM | POA: Insufficient documentation

## 2019-11-30 NOTE — Telephone Encounter (Signed)
lmom to schedule

## 2019-12-03 ENCOUNTER — Telehealth: Payer: Self-pay | Admitting: *Deleted

## 2019-12-03 NOTE — Telephone Encounter (Signed)
To Dr. Saunders Revel to review.  Dr. Saunders Revel, the lexiscan was ordered back in September 2020. Thoughts at this point on doing just a GXT?   Thanks!

## 2019-12-03 NOTE — Telephone Encounter (Signed)
Called and spoke with the patient regarding her stress test. Patient stated that the office is working on scheduling the test and were going to call her back today. Ask the patient to call the office once the test is scheduled so we can follow the results

## 2019-12-03 NOTE — Telephone Encounter (Signed)
If Jane Price prefers an exercise tolerance test, I am okay with that.  She should f/u in the office shortly after completion of the stress test.  Nelva Bush, MD [place]

## 2019-12-03 NOTE — Telephone Encounter (Signed)
Patient declined to schedule Lexi  and would only like to do a treadmill test.    Please advise.

## 2019-12-04 ENCOUNTER — Encounter: Payer: Self-pay | Admitting: Internal Medicine

## 2019-12-04 NOTE — Telephone Encounter (Signed)
Order for GXT entered.  Routing to scheduling to arrange for testing and f/u appt in office thereafter.  Thanks!

## 2019-12-04 NOTE — Addendum Note (Signed)
Addended by: Annia Belt on: 12/04/2019 08:55 AM   Modules accepted: Orders

## 2019-12-04 NOTE — Telephone Encounter (Signed)
Scheduled and mailed reminders

## 2019-12-10 ENCOUNTER — Other Ambulatory Visit
Admission: RE | Admit: 2019-12-10 | Discharge: 2019-12-10 | Disposition: A | Discharge status: Home / Self Care | Payer: BC Managed Care – PPO | Source: Ambulatory Visit | Attending: Internal Medicine | Admitting: Internal Medicine

## 2019-12-10 DIAGNOSIS — Z20822 Contact with and (suspected) exposure to covid-19: Secondary | ICD-10-CM | POA: Insufficient documentation

## 2019-12-10 DIAGNOSIS — Z01812 Encounter for preprocedural laboratory examination: Secondary | ICD-10-CM | POA: Diagnosis not present

## 2019-12-10 LAB — SARS CORONAVIRUS 2 (TAT 6-24 HRS): SARS Coronavirus 2: NEGATIVE

## 2019-12-11 ENCOUNTER — Other Ambulatory Visit: Payer: Self-pay

## 2019-12-11 ENCOUNTER — Ambulatory Visit (INDEPENDENT_AMBULATORY_CARE_PROVIDER_SITE_OTHER): Payer: BC Managed Care – PPO

## 2019-12-11 DIAGNOSIS — R0789 Other chest pain: Secondary | ICD-10-CM

## 2019-12-11 LAB — EXERCISE TOLERANCE TEST
Estimated workload: 10.1 METS
Exercise duration (min): 7 min
Exercise duration (sec): 0 s
MPHR: 155 {beats}/min
Peak HR: 160 {beats}/min
Percent HR: 103 %
RPE: 16
Rest HR: 69 {beats}/min

## 2019-12-12 NOTE — Progress Notes (Signed)
Follow-up Outpatient Visit Date: 12/13/2019  Primary Care Provider: Carol Ada, [place] [id number]  Chief Complaint: Follow-up chest pain and coronary artery calcification  HPI:  Jane Price is a 64 y.o. female with history of hypertension, PTSD, and uterine fibroids, who presents for follow-up of coronary artery calcification.  I saw [name] once in 04/2019 for evaluation of incidentally noted coronary artery calcification as well as back and shoulder pain.  I recommend noninvasive ischemia testing, which the patient elected to defer until earlier this week.  Exercise tolerance test was low risk without evidence of inducible ischemia.  Today, [name] reports that she is feeling well.  She has not had any further episodes of chest pain or shortness of breath.  She has occasional acceleration in her heart rate lasting a few seconds at a time.  This is not associated with any particular activity.  There are no associated symptoms.  She is planning to undergo hysterectomy for management of her uterine fibroids next month.  She is trying to walk regularly and does not have any limitations.  --------------------------------------------------------------------------------------------------  Cardiovascular History & Procedures: Cardiovascular Problems:  Atypical chest pain  Coronary artery calcification and aortic atherosclerosis  Risk Factors:  Coronary artery calcification and aortic atherosclerosis as well as hypertension  Cath/PCI:  None  CV Surgery:  None  EP Procedures and Devices:  None  Non-Invasive Evaluation(s):  Exercise tolerance test (12/11/2019): Negative stress test without evidence of ischemia at workload achieved.  Patient exercised for 7 minutes, achieving maximal heart rate of 160 bpm (103% MPHR).  Recent CV Pertinent Labs: Lab Results  Component Value Date   K 4.1 06/06/2019   BUN 8 06/06/2019   CREATININE 0.85 06/06/2019    Past medical and surgical history were reviewed and updated in EPIC.  Current Meds  Medication Sig  . albuterol (PROVENTIL HFA;VENTOLIN HFA) 108 (90 Base) MCG/ACT inhaler Inhale 1 puff into the lungs every 6 (six) hours as needed for wheezing or shortness of breath.  . famotidine (PEPCID) 20 MG tablet Take 20 mg by mouth 2 (two) times daily.  . fexofenadine (ALLEGRA) 180 MG tablet Take 180 mg by mouth daily.  . fluticasone (FLONASE) 50 MCG/ACT nasal spray Place 1 spray into both nostrils daily.  Marland Kitchen ibuprofen (ADVIL) 200 MG tablet Take 400 mg by mouth every 6 (six) hours as needed.  . Olopatadine HCl (PATADAY) 0.2 % SOLN Apply 1 drop to eye daily.  . rizatriptan (MAXALT-MLT) 10 MG disintegrating tablet Take 10 mg by mouth as needed.     Allergies: Erythromycin base, Other, Ciprofloxacin, Doxycycline, Metronidazole, Nasacort [triamcinolone], Tobramycin, Penicillins, and Sulfamethoxazole  Social History   Tobacco Use  . Smoking status: Never Smoker  . Smokeless tobacco: Never Used  Substance Use Topics  . Alcohol use: Never  . Drug use: Never    Family History  Problem Relation Age of Onset  . COPD Mother   . Cancer Sister        Breast Cancer    Review of Systems: A 12-system review of systems was performed and was negative except as noted in the HPI.  --------------------------------------------------------------------------------------------------  Physical Exam: BP 120/78 (BP Location: Left Arm, Patient Position: Sitting, Cuff Size: Normal)   Pulse 65   Ht 5\' 3"  (1.6 m)   Wt 146 lb 4 oz (66.3 kg)   SpO2 98%   BMI 25.91 kg/m   General: NAD. Neck: No JVD or HJR. Lungs: Clear  to auscultation bilateral without wheezes or crackles. Heart: Regular rate and rhythm without murmurs, rubs, or gallops. Abdomen: Soft, nontender, nondistended. Extremities: No lower extremity edema.  EKG: Normal sinus rhythm without abnormality.  Lab Results    Component Value Date   WBC 7.7 08/25/2018   HGB 12.1 08/25/2018   HCT 37.6 08/25/2018   MCV 87.4 08/25/2018   PLT 338 08/25/2018    Lab Results  Component Value Date   NA 142 06/06/2019   K 4.1 06/06/2019   CL 106 06/06/2019   CO2 25 06/06/2019   BUN 8 06/06/2019   CREATININE 0.85 06/06/2019   GLUCOSE 101 (H) 06/06/2019    No results found for: CHOL, HDL, LDLCALC, LDLDIRECT, TRIG, CHOLHDL  --------------------------------------------------------------------------------------------------  ASSESSMENT AND PLAN: Chest pain and coronary artery calcification: Chest pain noted at our visit last year has resolved.  Exercise tolerance test earlier this week was low risk without ischemic changes.  I have again reviewed her chest CT performed last year, which showed a small amount of calcification in the region of the distal LMCA/ostial LAD.  I think it would be reasonable to continue with age-appropriate primary prevention, including blood pressure and lipid control under the guidance of Dr. Tamala Julian.  Aspirin has been discontinued in anticipation of hysterectomy, and I see no strong indication to restart this.  Preoperative cardiovascular assessment: [name] is scheduled for hysterectomy next month by Dr. Denman George.  Given lack of ongoing cardiac symptoms and low risk exercise tolerance test, I feel that she is low risk for perioperative cardiovascular complications and can proceed without additional testing or intervention.  Follow-up: Return to clinic as needed.  Nelva Bush, MD 12/13/2019 1:12 PM

## 2019-12-13 ENCOUNTER — Encounter: Payer: Self-pay | Admitting: Internal Medicine

## 2019-12-13 ENCOUNTER — Other Ambulatory Visit: Payer: Self-pay

## 2019-12-13 ENCOUNTER — Ambulatory Visit (INDEPENDENT_AMBULATORY_CARE_PROVIDER_SITE_OTHER): Payer: BC Managed Care – PPO | Admitting: Internal Medicine

## 2019-12-13 VITALS — BP 120/78 | HR 65 | Ht 63.0 in | Wt 146.2 lb

## 2019-12-13 DIAGNOSIS — I2584 Coronary atherosclerosis due to calcified coronary lesion: Secondary | ICD-10-CM | POA: Diagnosis not present

## 2019-12-13 DIAGNOSIS — R079 Chest pain, unspecified: Secondary | ICD-10-CM | POA: Diagnosis not present

## 2019-12-13 DIAGNOSIS — Z01810 Encounter for preprocedural cardiovascular examination: Secondary | ICD-10-CM

## 2019-12-13 DIAGNOSIS — I2510 Atherosclerotic heart disease of native coronary artery without angina pectoris: Secondary | ICD-10-CM

## 2019-12-13 NOTE — Patient Instructions (Signed)
Medication Instructions:  Your physician recommends that you continue on your current medications as directed. Please refer to the Current Medication list given to you today.  *If you need a refill on your cardiac medications before your next appointment, please call your pharmacy*   Follow-Up: At [facility], you and your health needs are our priority.  As part of our continuing mission to provide you with exceptional heart care, we have created designated Provider Care Teams.  These Care Teams include your primary Cardiologist (physician) and Advanced Practice Providers (APPs -  Physician Assistants and Nurse Practitioners) who all work together to provide you with the care you need, when you need it.  We recommend signing up for the patient portal called "MyChart".  Sign up information is provided on this After Visit Summary.  MyChart is used to connect with patients for Virtual Visits (Telemedicine).  Patients are able to view lab/test results, encounter notes, upcoming appointments, etc.  Non-urgent messages can be sent to your provider as well.   To learn more about what you can do with MyChart, go to [email or web address].    Your next appointment:   AS NEEDED.   The format for your next appointment:   In Person  Provider:    You may see DR Harrell Gave END or one of the following Advanced Practice Providers on your designated Care Team:    Murray Hodgkins, NP  Christell Faith, PA-C  Marrianne Mood, PA-C

## 2019-12-19 DIAGNOSIS — M79672 Pain in left foot: Secondary | ICD-10-CM | POA: Diagnosis not present

## 2019-12-28 ENCOUNTER — Telehealth: Payer: Self-pay | Admitting: *Deleted

## 2019-12-28 NOTE — Telephone Encounter (Signed)
Spoke with Jane Price and she will cancel January 10, 2020. Gave her the following potential dates in July. She is aware that these canbe book prior to her calling back Tuesday 01-01-20 with a chosen date. July,8,13,15, and 27.

## 2019-12-28 NOTE — Telephone Encounter (Signed)
Patient called and stated " I need to let Melissa and Dr Denman George know I need to ouch my surgery back. I still am in the middle of moving and arranging care for my disabled daughter. Would it be easier to reschedule now or finish moving and have the care in place then call you to schedule?"

## 2020-01-01 ENCOUNTER — Encounter (HOSPITAL_COMMUNITY)
Admission: RE | Admit: 2020-01-01 | Discharge: 2020-01-01 | Disposition: A | Discharge status: Home / Self Care | Payer: BC Managed Care – PPO | Source: Ambulatory Visit | Attending: Gynecologic Oncology | Admitting: Gynecologic Oncology

## 2020-01-01 ENCOUNTER — Encounter (HOSPITAL_COMMUNITY): Admission: RE | Admit: 2020-01-01 | Payer: BC Managed Care – PPO | Source: Ambulatory Visit

## 2020-01-10 DIAGNOSIS — D259 Leiomyoma of uterus, unspecified: Secondary | ICD-10-CM

## 2020-01-10 DIAGNOSIS — K439 Ventral hernia without obstruction or gangrene: Secondary | ICD-10-CM

## 2020-01-30 ENCOUNTER — Ambulatory Visit: Payer: BC Managed Care – PPO | Admitting: Gynecologic Oncology

## 2020-02-12 DIAGNOSIS — F411 Generalized anxiety disorder: Secondary | ICD-10-CM | POA: Diagnosis not present

## 2020-02-19 DIAGNOSIS — F411 Generalized anxiety disorder: Secondary | ICD-10-CM | POA: Diagnosis not present

## 2020-02-26 DIAGNOSIS — F411 Generalized anxiety disorder: Secondary | ICD-10-CM | POA: Diagnosis not present

## 2020-03-04 DIAGNOSIS — F411 Generalized anxiety disorder: Secondary | ICD-10-CM | POA: Diagnosis not present

## 2020-03-11 DIAGNOSIS — F411 Generalized anxiety disorder: Secondary | ICD-10-CM | POA: Diagnosis not present

## 2020-03-18 DIAGNOSIS — F411 Generalized anxiety disorder: Secondary | ICD-10-CM | POA: Diagnosis not present

## 2020-03-25 DIAGNOSIS — F411 Generalized anxiety disorder: Secondary | ICD-10-CM | POA: Diagnosis not present

## 2020-04-01 DIAGNOSIS — F411 Generalized anxiety disorder: Secondary | ICD-10-CM | POA: Diagnosis not present

## 2020-04-08 DIAGNOSIS — F411 Generalized anxiety disorder: Secondary | ICD-10-CM | POA: Diagnosis not present

## 2020-04-15 DIAGNOSIS — F411 Generalized anxiety disorder: Secondary | ICD-10-CM | POA: Diagnosis not present

## 2020-04-22 DIAGNOSIS — F411 Generalized anxiety disorder: Secondary | ICD-10-CM | POA: Diagnosis not present

## 2020-04-29 DIAGNOSIS — Z85828 Personal history of other malignant neoplasm of skin: Secondary | ICD-10-CM | POA: Diagnosis not present

## 2020-04-29 DIAGNOSIS — L814 Other melanin hyperpigmentation: Secondary | ICD-10-CM | POA: Diagnosis not present

## 2020-04-29 DIAGNOSIS — F411 Generalized anxiety disorder: Secondary | ICD-10-CM | POA: Diagnosis not present

## 2020-04-29 DIAGNOSIS — L821 Other seborrheic keratosis: Secondary | ICD-10-CM | POA: Diagnosis not present

## 2020-04-29 DIAGNOSIS — D2261 Melanocytic nevi of right upper limb, including shoulder: Secondary | ICD-10-CM | POA: Diagnosis not present

## 2020-05-06 DIAGNOSIS — F411 Generalized anxiety disorder: Secondary | ICD-10-CM | POA: Diagnosis not present

## 2020-05-20 DIAGNOSIS — F411 Generalized anxiety disorder: Secondary | ICD-10-CM | POA: Diagnosis not present

## 2020-05-27 DIAGNOSIS — F411 Generalized anxiety disorder: Secondary | ICD-10-CM | POA: Diagnosis not present

## 2020-06-03 DIAGNOSIS — F411 Generalized anxiety disorder: Secondary | ICD-10-CM | POA: Diagnosis not present

## 2020-06-10 DIAGNOSIS — F411 Generalized anxiety disorder: Secondary | ICD-10-CM | POA: Diagnosis not present

## 2020-06-12 ENCOUNTER — Ambulatory Visit (HOSPITAL_COMMUNITY)
Admission: RE | Admit: 2020-06-12 | Payer: BC Managed Care – PPO | Source: Ambulatory Visit | Admitting: Gynecologic Oncology

## 2020-06-12 ENCOUNTER — Encounter (HOSPITAL_COMMUNITY): Admission: RE | Payer: Self-pay | Source: Ambulatory Visit

## 2020-06-12 SURGERY — HYSTERECTOMY, TOTAL, ROBOT-ASSISTED
Anesthesia: General

## 2020-06-17 DIAGNOSIS — F411 Generalized anxiety disorder: Secondary | ICD-10-CM | POA: Diagnosis not present

## 2020-06-24 DIAGNOSIS — F411 Generalized anxiety disorder: Secondary | ICD-10-CM | POA: Diagnosis not present

## 2020-07-01 DIAGNOSIS — F411 Generalized anxiety disorder: Secondary | ICD-10-CM | POA: Diagnosis not present

## 2020-07-08 DIAGNOSIS — F411 Generalized anxiety disorder: Secondary | ICD-10-CM | POA: Diagnosis not present

## 2020-07-10 DIAGNOSIS — Z23 Encounter for immunization: Secondary | ICD-10-CM | POA: Diagnosis not present

## 2020-07-10 DIAGNOSIS — E785 Hyperlipidemia, unspecified: Secondary | ICD-10-CM | POA: Diagnosis not present

## 2020-07-10 DIAGNOSIS — M8580 Other specified disorders of bone density and structure, unspecified site: Secondary | ICD-10-CM | POA: Diagnosis not present

## 2020-07-10 DIAGNOSIS — D219 Benign neoplasm of connective and other soft tissue, unspecified: Secondary | ICD-10-CM | POA: Diagnosis not present

## 2020-07-10 DIAGNOSIS — Z0000 Encounter for general adult medical examination without abnormal findings: Secondary | ICD-10-CM | POA: Diagnosis not present

## 2020-07-10 DIAGNOSIS — I700 Atherosclerosis of aorta: Secondary | ICD-10-CM | POA: Diagnosis not present

## 2020-07-12 DIAGNOSIS — T50Z95A Adverse effect of other vaccines and biological substances, initial encounter: Secondary | ICD-10-CM | POA: Diagnosis not present

## 2020-07-15 DIAGNOSIS — F411 Generalized anxiety disorder: Secondary | ICD-10-CM | POA: Diagnosis not present

## 2020-07-22 DIAGNOSIS — F411 Generalized anxiety disorder: Secondary | ICD-10-CM | POA: Diagnosis not present

## 2020-07-24 ENCOUNTER — Other Ambulatory Visit: Payer: Self-pay | Admitting: Family Medicine

## 2020-07-24 DIAGNOSIS — E2839 Other primary ovarian failure: Secondary | ICD-10-CM

## 2020-07-24 DIAGNOSIS — M8580 Other specified disorders of bone density and structure, unspecified site: Secondary | ICD-10-CM

## 2020-07-29 DIAGNOSIS — F411 Generalized anxiety disorder: Secondary | ICD-10-CM | POA: Diagnosis not present

## 2020-08-05 DIAGNOSIS — F411 Generalized anxiety disorder: Secondary | ICD-10-CM | POA: Diagnosis not present

## 2020-08-12 DIAGNOSIS — F411 Generalized anxiety disorder: Secondary | ICD-10-CM | POA: Diagnosis not present

## 2020-08-19 DIAGNOSIS — F411 Generalized anxiety disorder: Secondary | ICD-10-CM | POA: Diagnosis not present

## 2020-08-22 DIAGNOSIS — E785 Hyperlipidemia, unspecified: Secondary | ICD-10-CM | POA: Diagnosis not present

## 2020-08-22 DIAGNOSIS — H9201 Otalgia, right ear: Secondary | ICD-10-CM | POA: Diagnosis not present

## 2020-08-26 DIAGNOSIS — F411 Generalized anxiety disorder: Secondary | ICD-10-CM | POA: Diagnosis not present

## 2020-09-02 DIAGNOSIS — F411 Generalized anxiety disorder: Secondary | ICD-10-CM | POA: Diagnosis not present

## 2020-09-09 DIAGNOSIS — F411 Generalized anxiety disorder: Secondary | ICD-10-CM | POA: Diagnosis not present

## 2020-09-16 DIAGNOSIS — F411 Generalized anxiety disorder: Secondary | ICD-10-CM | POA: Diagnosis not present

## 2020-09-23 DIAGNOSIS — F411 Generalized anxiety disorder: Secondary | ICD-10-CM | POA: Diagnosis not present

## 2020-09-30 DIAGNOSIS — F411 Generalized anxiety disorder: Secondary | ICD-10-CM | POA: Diagnosis not present

## 2020-10-14 DIAGNOSIS — F411 Generalized anxiety disorder: Secondary | ICD-10-CM | POA: Diagnosis not present

## 2020-10-21 DIAGNOSIS — F411 Generalized anxiety disorder: Secondary | ICD-10-CM | POA: Diagnosis not present

## 2020-10-28 DIAGNOSIS — F411 Generalized anxiety disorder: Secondary | ICD-10-CM | POA: Diagnosis not present

## 2020-11-04 DIAGNOSIS — F411 Generalized anxiety disorder: Secondary | ICD-10-CM | POA: Diagnosis not present

## 2020-11-11 DIAGNOSIS — F411 Generalized anxiety disorder: Secondary | ICD-10-CM | POA: Diagnosis not present

## 2020-11-15 IMAGING — US US EXTREM LOW VENOUS*L*
1 series · 13 of 24 positions shown · non-contrast
Comparison: None.

CLINICAL DATA: Left leg pain following long car trip



[Series 1: us extrem low venous*left* · 13 of 33 slices shown]
[im 1/33]
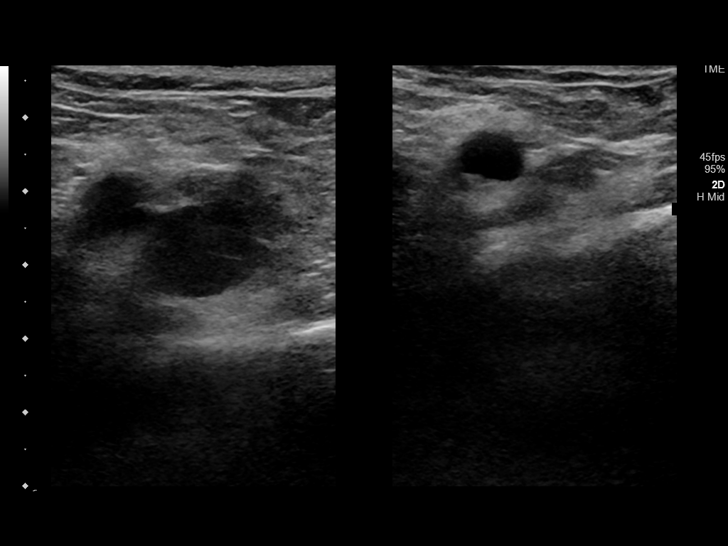
[im 3/33]
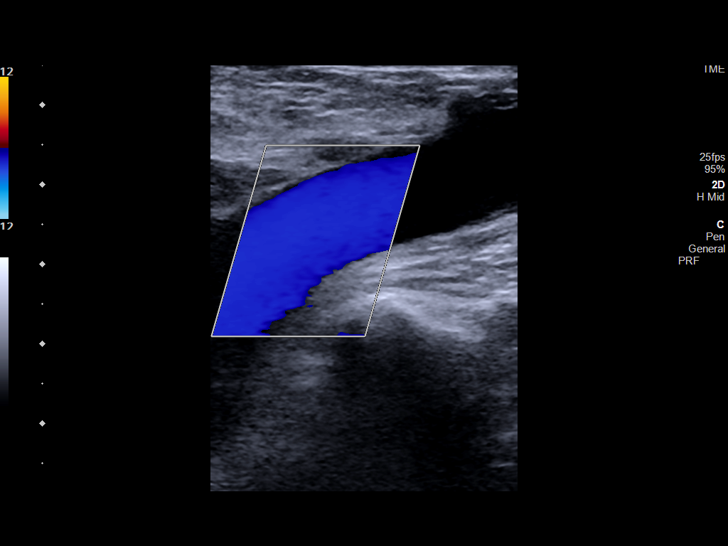
[im 6/33]
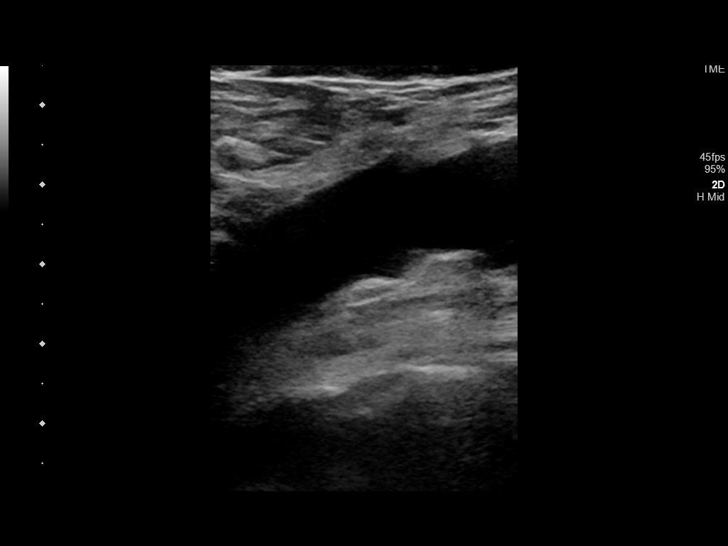
[im 9/33]
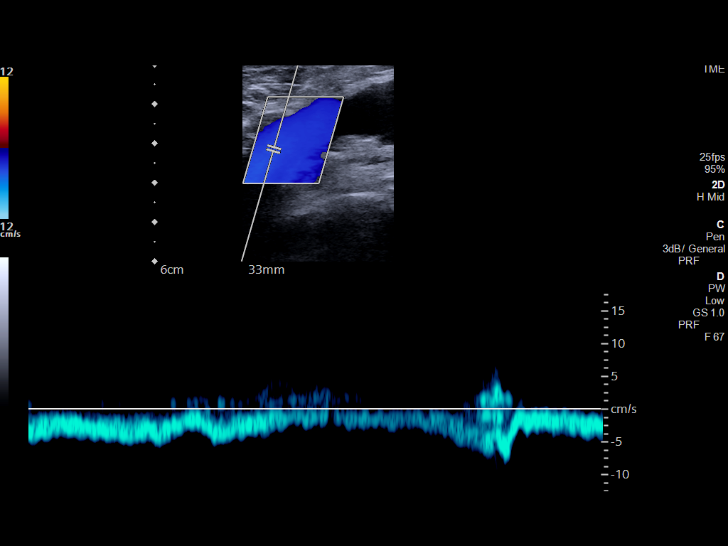
[im 12/33]
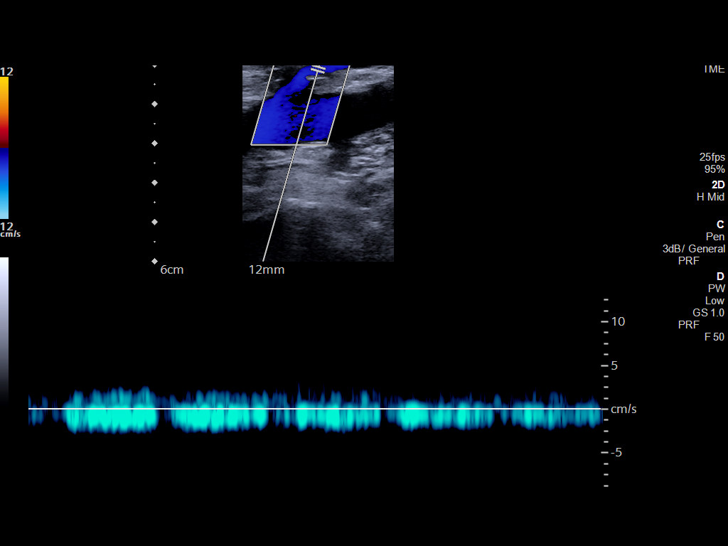
[im 14/33]
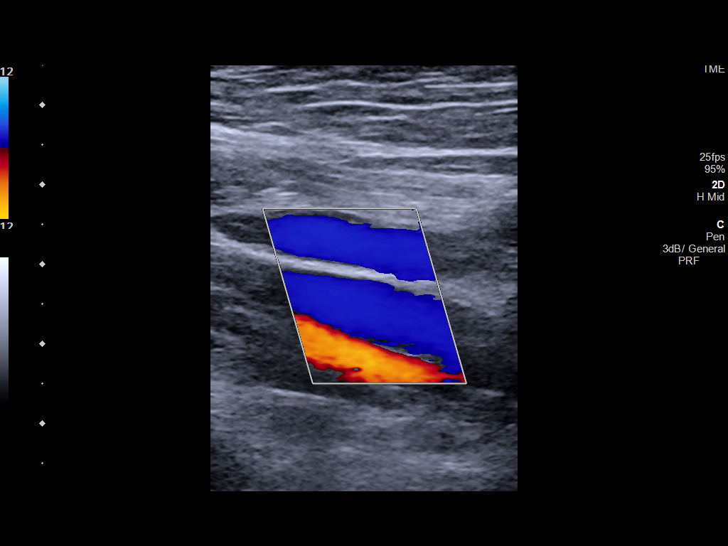
[im 17/33]
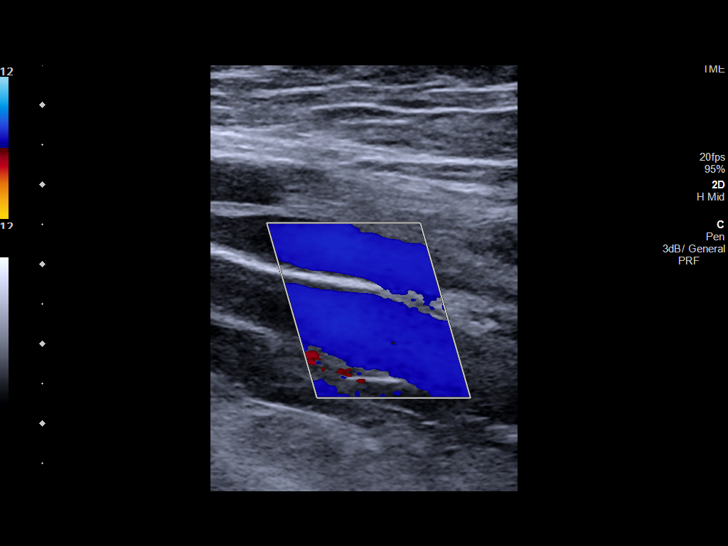
[im 19/33]
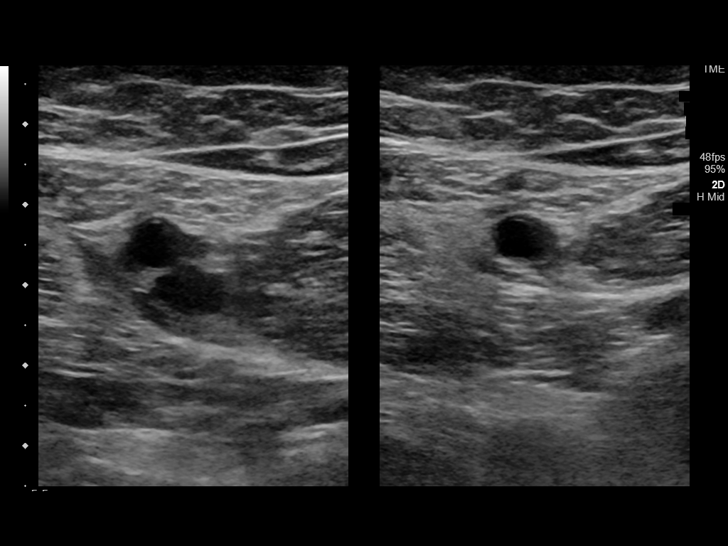
[im 21/33]
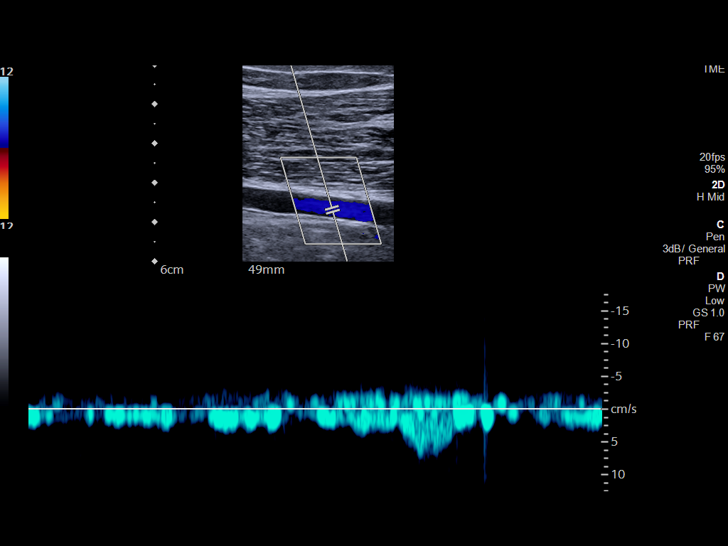
[im 24/33]
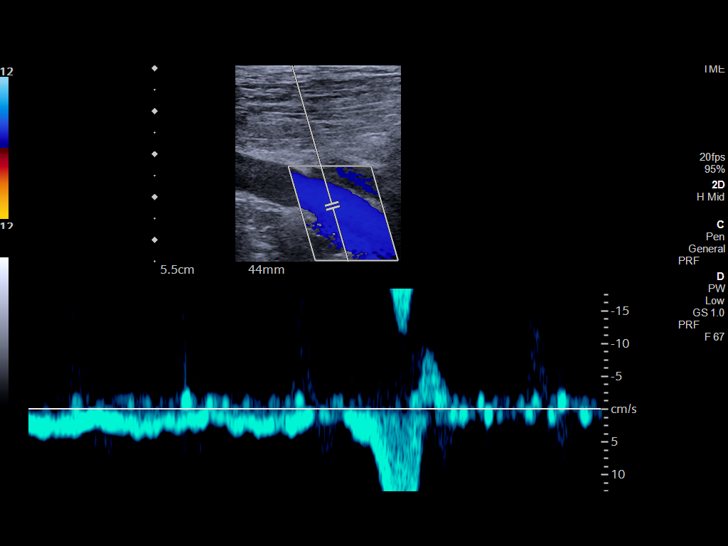
[im 27/33]
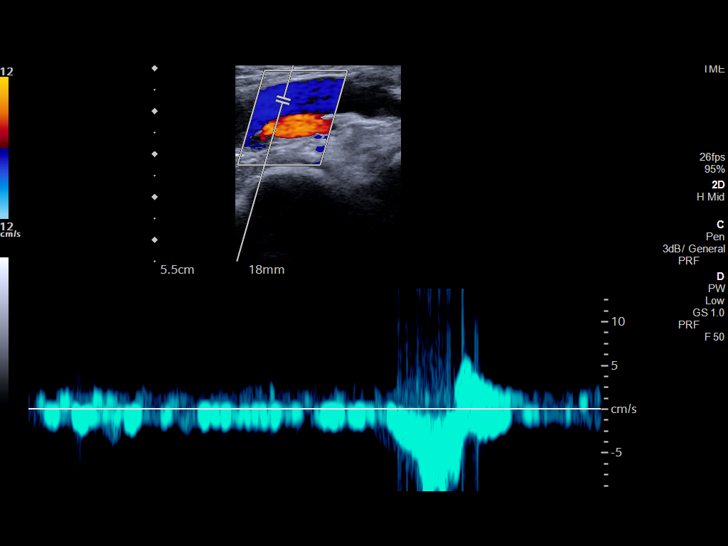
[im 30/33]
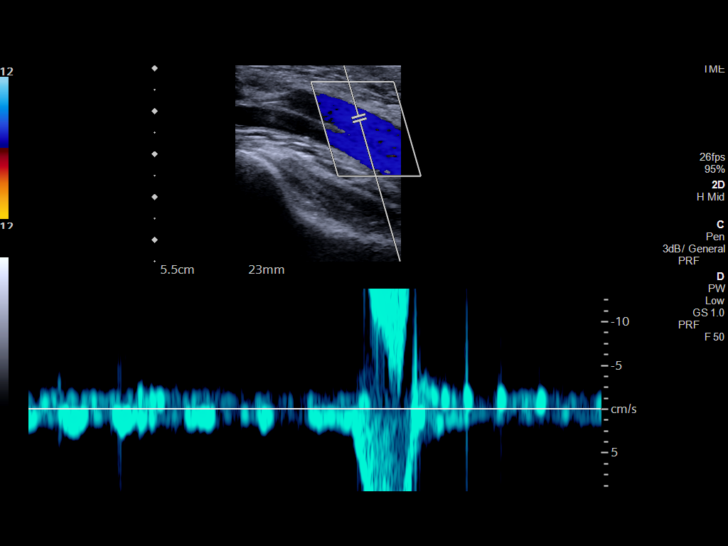
[im 33/33]
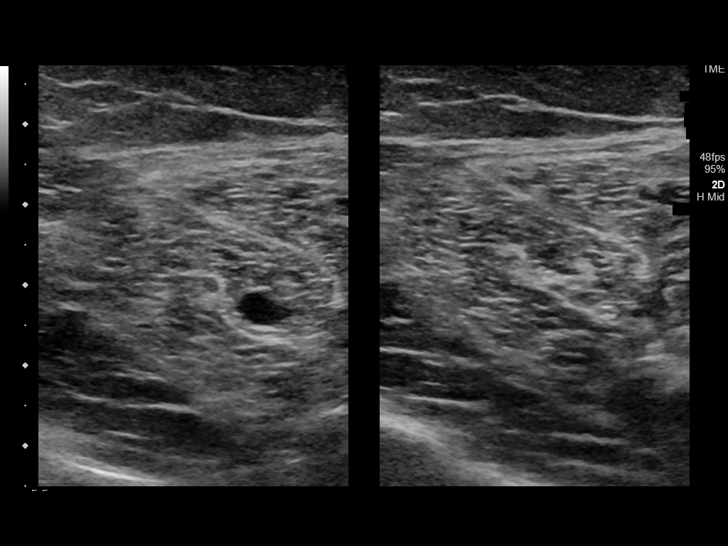

[13 of 24 positions shown; findings below may reference images not displayed]

FINDINGS: Contralateral Common Femoral Vein: Respiratory phasicity is normal
and symmetric with the symptomatic side. No evidence of thrombus.
Normal compressibility.

Common Femoral Vein: No evidence of thrombus. Normal
compressibility, respiratory phasicity and response to augmentation.

Saphenofemoral Junction: No evidence of thrombus. Normal
compressibility and flow on color Doppler imaging.

Profunda Femoral Vein: No evidence of thrombus. Normal
compressibility and flow on color Doppler imaging.

Femoral Vein: No evidence of thrombus. Normal compressibility,
respiratory phasicity and response to augmentation.

Popliteal Vein: No evidence of thrombus. Normal compressibility,
respiratory phasicity and response to augmentation.

Calf Veins: No evidence of thrombus. Normal compressibility and flow
on color Doppler imaging.

Superficial Great Saphenous Vein: No evidence of thrombus. Normal
compressibility.

Venous Reflux:  None.

Other Findings:  None.
IMPRESSION: No evidence of deep venous thrombosis.

## 2020-11-18 DIAGNOSIS — F411 Generalized anxiety disorder: Secondary | ICD-10-CM | POA: Diagnosis not present

## 2020-11-25 DIAGNOSIS — F411 Generalized anxiety disorder: Secondary | ICD-10-CM | POA: Diagnosis not present

## 2020-12-01 ENCOUNTER — Other Ambulatory Visit: Payer: BC Managed Care – PPO

## 2020-12-09 DIAGNOSIS — F411 Generalized anxiety disorder: Secondary | ICD-10-CM | POA: Diagnosis not present

## 2020-12-19 DIAGNOSIS — E785 Hyperlipidemia, unspecified: Secondary | ICD-10-CM | POA: Diagnosis not present

## 2020-12-23 DIAGNOSIS — F411 Generalized anxiety disorder: Secondary | ICD-10-CM | POA: Diagnosis not present

## 2020-12-30 DIAGNOSIS — F411 Generalized anxiety disorder: Secondary | ICD-10-CM | POA: Diagnosis not present

## 2021-01-13 DIAGNOSIS — F411 Generalized anxiety disorder: Secondary | ICD-10-CM | POA: Diagnosis not present

## 2021-02-01 DIAGNOSIS — R030 Elevated blood-pressure reading, without diagnosis of hypertension: Secondary | ICD-10-CM | POA: Diagnosis not present

## 2021-02-01 DIAGNOSIS — Z20822 Contact with and (suspected) exposure to covid-19: Secondary | ICD-10-CM | POA: Diagnosis not present

## 2021-02-01 DIAGNOSIS — Z03818 Encounter for observation for suspected exposure to other biological agents ruled out: Secondary | ICD-10-CM | POA: Diagnosis not present

## 2021-02-03 DIAGNOSIS — F411 Generalized anxiety disorder: Secondary | ICD-10-CM | POA: Diagnosis not present

## 2021-02-10 DIAGNOSIS — F411 Generalized anxiety disorder: Secondary | ICD-10-CM | POA: Diagnosis not present

## 2021-02-17 DIAGNOSIS — F411 Generalized anxiety disorder: Secondary | ICD-10-CM | POA: Diagnosis not present

## 2021-02-19 ENCOUNTER — Encounter: Payer: Self-pay | Admitting: Gynecologic Oncology

## 2021-02-19 DIAGNOSIS — G43909 Migraine, unspecified, not intractable, without status migrainosus: Secondary | ICD-10-CM | POA: Diagnosis not present

## 2021-02-19 DIAGNOSIS — I700 Atherosclerosis of aorta: Secondary | ICD-10-CM | POA: Diagnosis not present

## 2021-02-19 DIAGNOSIS — E2839 Other primary ovarian failure: Secondary | ICD-10-CM | POA: Diagnosis not present

## 2021-02-19 DIAGNOSIS — E785 Hyperlipidemia, unspecified: Secondary | ICD-10-CM | POA: Diagnosis not present

## 2021-02-20 ENCOUNTER — Other Ambulatory Visit: Payer: Self-pay | Admitting: Family Medicine

## 2021-02-20 DIAGNOSIS — Z1231 Encounter for screening mammogram for malignant neoplasm of breast: Secondary | ICD-10-CM

## 2021-02-20 DIAGNOSIS — E2839 Other primary ovarian failure: Secondary | ICD-10-CM

## 2021-02-24 DIAGNOSIS — F411 Generalized anxiety disorder: Secondary | ICD-10-CM | POA: Diagnosis not present

## 2021-02-25 ENCOUNTER — Other Ambulatory Visit: Payer: Self-pay

## 2021-02-25 ENCOUNTER — Inpatient Hospital Stay: Payer: BC Managed Care – PPO | Attending: Gynecologic Oncology | Admitting: Gynecologic Oncology

## 2021-02-25 ENCOUNTER — Encounter: Payer: Self-pay | Admitting: Gynecologic Oncology

## 2021-02-25 VITALS — BP 174/97 | HR 95 | Temp 99.1°F | Resp 18 | Ht 63.0 in | Wt 144.0 lb

## 2021-02-25 DIAGNOSIS — K439 Ventral hernia without obstruction or gangrene: Secondary | ICD-10-CM | POA: Insufficient documentation

## 2021-02-25 DIAGNOSIS — G43909 Migraine, unspecified, not intractable, without status migrainosus: Secondary | ICD-10-CM | POA: Diagnosis not present

## 2021-02-25 DIAGNOSIS — E162 Hypoglycemia, unspecified: Secondary | ICD-10-CM | POA: Insufficient documentation

## 2021-02-25 DIAGNOSIS — N83202 Unspecified ovarian cyst, left side: Secondary | ICD-10-CM | POA: Insufficient documentation

## 2021-02-25 DIAGNOSIS — D250 Submucous leiomyoma of uterus: Secondary | ICD-10-CM

## 2021-02-25 DIAGNOSIS — Z803 Family history of malignant neoplasm of breast: Secondary | ICD-10-CM | POA: Insufficient documentation

## 2021-02-25 DIAGNOSIS — Z780 Asymptomatic menopausal state: Secondary | ICD-10-CM | POA: Diagnosis not present

## 2021-02-25 DIAGNOSIS — I2510 Atherosclerotic heart disease of native coronary artery without angina pectoris: Secondary | ICD-10-CM | POA: Insufficient documentation

## 2021-02-25 DIAGNOSIS — F4310 Post-traumatic stress disorder, unspecified: Secondary | ICD-10-CM | POA: Diagnosis not present

## 2021-02-25 DIAGNOSIS — D251 Intramural leiomyoma of uterus: Secondary | ICD-10-CM | POA: Insufficient documentation

## 2021-02-25 DIAGNOSIS — Z79899 Other long term (current) drug therapy: Secondary | ICD-10-CM | POA: Insufficient documentation

## 2021-02-25 NOTE — Progress Notes (Signed)
Follow-up Note: Gyn-Onc  Consult was requested by Dr. Landry Mellow for the evaluation of Jane Price 65 y.o. female  CC:  Chief Complaint  Patient presents with   Fibroids    Assessment/Plan:  Jane Price  is a 65 y.o.  year old with a large (30cm) fibroid uterus and ventral hernia.   She is interested in surgery (definitive surgery with hysterectomy and bilateral salpingectomy).  She desires to retain her ovaries. However, she has significant anxiety issues surrounding the concept of surgery that are resultant of her prior negative experience during a traumatic cesarean section.  I feel that she is a reasonable candidate for a robotically assisted total hysterectomy for uterus greater than 250 g with bilateral salpingectomy with minilaparotomy for specimen delivery and ventral hernia repair. We will likely use her vertical midline incision for specimen delivery in order to repair the hernia at the same time.  I discussed the potential risks of the surgery including bleeding, hemorrhage, blood transfusion, DVT or PE, deep pelvic or skin infection, damage to adjacent structures.  I explained that risks are higher when operating on bulky large uteri.  I explained that if we perform a hysterectomy minimally invasively this will be associated with decreased blood loss and perioperative complications.  She would require mini laparotomy for specimen delivery.  We would remove her fibroid in a contained fashion.  There would not be uncontained morcellation within the peritoneal cavity.  We will obtain a repeat MRI closer to the date of her surgery to ensure that there has not been substantial growth in the fibroids (or hernia) which might necessitate a change in surgical plan (eg needing CCS to assist in hernia closure).   She is currently undergoing therapy for this issue and will contact us when she is ready to schedule her MRI and surgery.   HPI: Jane Price is a 65 year old P1 who is seen  in consultation at the request of Dr Landry Mellow for a large fibroid uterus.   The patient has had a 30 cm fibroid uterus for several years, possibly 8 years.  She desires definitive management for this as it is associated with some bulk symptoms and she perceives that it increases and decreases in size based on her diet.  The patient began having some work-up for chest pain in the summer 2020 this included a CT scan of the chest which was negative for PE but did capture the upper aspect of an abdominopelvic mass.  As part of her chest pain work-up she was seen by Dr. And who recommended a stress test.  She did not desire the perfusion stress test and instead hope to optimize her ambulatory skills in order to undergo a exercise stress test.  She is planning to reinitiate this in January 2021.  To follow-up the development of the abdominopelvic mass and the possible subtle increase in size she followed up with Dr. Landry Mellow.  Dr. Landry Mellow performed an ultrasound scan on May 15, 2019 which revealed a pelvic mass seen well above the umbilicus, unable to measure due to size, unable to completely visualize the entire mass.  It approximately measures 30 x 23.3 x 14.8 cm.  A Pap test was performed on 05/03/2017 which had been normal with negative vaginitis screen.  Her personal history is significant for 1 prior cesarean section and tonsillectomy.  She is treated for hypoglycemia, PTSD, migraines, and coronary artery disease with atherosclerosis seen on CT scan of the chest in September 2020.  Her  GYN history is significant for known fibroid uterus.  She underwent menopause at age 24 and had no postmenopausal bleeding.  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  Her family history is significant for 2 sisters who had breast cancer diagnosis in the postmenopausal years.  Interval Hx:  She was offered surgery in November, 2020 but declined at that time.  MRI from November, 2020 showed a uterus  measuring 27.0 x 14.9 by 18.7 cm (volume = 3940 cm^3). A dominant large intramural fibroid is seen occupying nearly the entire uterine corpus and fundus which measures 22.6 x 14.9 x 18.7cm. The enlarged uterus extends into the mid abdomen, just to the left of midline. A 1.2 cm intramural fibroid is also seen in the right lateral lower uterine segment. There was a 2.2cm left ovarian cyst. There was left hydroureter from compression.   She reengaged in care and expressed a desire for definitive surgery for her fibroid uterus in June, 2021 after she has moved house. However, at that time experienced anxiety about the surgery and cancelled.  She has subsequently initiated care with a psychologist for counseling and feels that her anxiety is related to a prior traumatic cesarean section for her daughter which resulted in her daughter experiencing severe neurologic deficits.  Today we predominantly discussed the procedure, its risks, and anticipated recovery.    Current Meds:  Outpatient Encounter Medications as of 02/25/2021  Medication Sig   albuterol (PROVENTIL HFA;VENTOLIN HFA) 108 (90 Base) MCG/ACT inhaler Inhale 1 puff into the lungs every 6 (six) hours as needed for wheezing or shortness of breath.   famotidine (PEPCID) 20 MG tablet Take 20 mg by mouth 2 (two) times daily.   fexofenadine (ALLEGRA) 180 MG tablet Take 180 mg by mouth daily.   fluticasone (FLONASE) 50 MCG/ACT nasal spray Place 1 spray into both nostrils daily.   ibuprofen (ADVIL) 200 MG tablet Take 400 mg by mouth every 6 (six) hours as needed.   Olopatadine HCl 0.2 % SOLN Apply 1 drop to eye daily.   rizatriptan (MAXALT-MLT) 10 MG disintegrating tablet Take 10 mg by mouth as needed.    sodium fluoride (PREVIDENT 5000 PLUS) 1.1 % CREA dental cream Place 1 application onto teeth in the morning and at bedtime.   No facility-administered encounter medications on file as of 02/25/2021.    Allergy:  Allergies  Allergen Reactions    Erythromycin Base Other (See Comments)   Other Other (See Comments)   Ciprofloxacin     Fever   Doxycycline     headache   Metronidazole     bruising   Nasacort [Triamcinolone]    Tobramycin     headache   Penicillins Rash   Sulfamethoxazole Rash    Social Hx:   Social History   Socioeconomic History   Marital status: Married    Spouse name: Not on file   Number of children: Not on file   Years of education: Not on file   Highest education level: Not on file  Occupational History   Not on file  Tobacco Use   Smoking status: Never   Smokeless tobacco: Never  Vaping Use   Vaping Use: Never used  Substance and Sexual Activity   Alcohol use: Never   Drug use: Never   Sexual activity: Yes  Other Topics Concern   Not on file  Social History Narrative   Not on file   Social Determinants of Health   Financial Resource Strain: Not on  file  Food Insecurity: Not on file  Transportation Needs: Not on file  Physical Activity: Not on file  Stress: Not on file  Social Connections: Not on file  Intimate Partner Violence: Not on file    Past Surgical Hx:  Past Surgical History:  Procedure Laterality Date   ADENOIDECTOMY     CESAREAN SECTION     TONSILLECTOMY      Past Medical Hx:  Past Medical History:  Diagnosis Date   Abdominal mass 04/2019   CT scan    Allergic rhinitis    Atherosclerosis of aorta (HCC)    Atherosclerosis of coronary artery    Fibroids    Fibroids    Hypertension    Lung nodules 04/2019   Migraines    PTSD (post-traumatic stress disorder)    Sinus tachycardia    Uterine fibroid     Past Gynecological History:  See HPI No LMP recorded. Patient is postmenopausal.  Family Hx:  Family History  Problem Relation Age of Onset   COPD Mother    Cancer Sister        Breast Cancer    Review of Systems:  Constitutional  Feels well,   ENT Normal appearing ears and nares bilaterally Skin/Breast  No rash, sores, jaundice, itching,  dryness Cardiovascular  No chest pain, shortness of breath, or edema  Pulmonary  No cough or wheeze.  Gastro Intestinal  No nausea, vomitting, or diarrhoea. No bright red blood per rectum, no abdominal pain, change in bowel movement, or constipation.  Genito Urinary  No frequency, urgency, dysuria, no bleeding Musculo Skeletal  No myalgia, arthralgia, joint swelling or pain  Neurologic  No weakness, numbness, change in gait,  Psychology  No depression, anxiety, insomnia.   Vitals:  Blood pressure (!) 174/97, pulse 95, temperature 99.1 F (37.3 C), temperature source Tympanic, resp. rate 18, height '5\' 3"'$  (1.6 m), weight 144 lb (65.3 kg), SpO2 100 %.  Physical Exam: WD in NAD Neck  Supple NROM, without any enlargements.  Lymph Node Survey No cervical supraclavicular or inguinal adenopathy Cardiovascular  Pulse normal rate, regularity and rhythm. S1 and S2 normal.  Lungs  Clear to auscultation bilateraly, without wheezes/crackles/rhonchi. Good air movement.  Skin  No rash/lesions/breakdown  Psychiatry  Alert and oriented to person, place, and time  Abdomen  Normoactive bowel sounds, abdomen soft, non-tender and thin with soft, reducible, 5cm ventral hernia at the superior aspect of her infraumbilical vertical midline incision. There is a large abdominopelvic mass filling the pelvis and abdomen extending to a handspan below xiphoid. Back No CVA tenderness Genito Urinary  Vulva/vagina: Normal external female genitalia.  No lesions. No discharge or bleeding.  Bladder/urethra:  No lesions or masses, well supported bladder  Vagina: normal  Cervix: Normal appearing, no lesions.  Uterus: 30+cm, mobile, no parametrial involvement or nodularity. Narrow lower uterine segment.  Adnexa: no discrete other masses. Rectal  deferred Extremities  No bilateral cyanosis, clubbing or edema.   Thereasa Solo, MD  02/25/2021, 12:16 PM

## 2021-02-25 NOTE — Patient Instructions (Signed)
Dr Denman George recommends calling her office at [phone] when you are ready to schedule your MRI.

## 2021-03-03 DIAGNOSIS — F411 Generalized anxiety disorder: Secondary | ICD-10-CM | POA: Diagnosis not present

## 2021-03-10 DIAGNOSIS — F411 Generalized anxiety disorder: Secondary | ICD-10-CM | POA: Diagnosis not present

## 2021-03-17 DIAGNOSIS — F411 Generalized anxiety disorder: Secondary | ICD-10-CM | POA: Diagnosis not present

## 2021-03-24 DIAGNOSIS — F411 Generalized anxiety disorder: Secondary | ICD-10-CM | POA: Diagnosis not present

## 2021-03-31 DIAGNOSIS — F411 Generalized anxiety disorder: Secondary | ICD-10-CM | POA: Diagnosis not present

## 2021-04-07 DIAGNOSIS — F411 Generalized anxiety disorder: Secondary | ICD-10-CM | POA: Diagnosis not present

## 2021-04-14 DIAGNOSIS — F411 Generalized anxiety disorder: Secondary | ICD-10-CM | POA: Diagnosis not present

## 2021-04-21 DIAGNOSIS — F411 Generalized anxiety disorder: Secondary | ICD-10-CM | POA: Diagnosis not present

## 2021-04-28 DIAGNOSIS — F411 Generalized anxiety disorder: Secondary | ICD-10-CM | POA: Diagnosis not present

## 2021-05-07 ENCOUNTER — Telehealth: Payer: Self-pay | Admitting: *Deleted

## 2021-05-07 NOTE — Telephone Encounter (Signed)
Patient called and scheduled a follow up appt with Dr Tucker  

## 2021-05-11 ENCOUNTER — Encounter: Payer: Self-pay | Admitting: Gynecologic Oncology

## 2021-05-11 ENCOUNTER — Inpatient Hospital Stay: Payer: BC Managed Care – PPO | Attending: Gynecologic Oncology | Admitting: Gynecologic Oncology

## 2021-05-11 ENCOUNTER — Other Ambulatory Visit: Payer: Self-pay

## 2021-05-11 VITALS — BP 151/73 | HR 67 | Temp 98.2°F | Resp 16 | Ht 63.0 in | Wt 145.6 lb

## 2021-05-11 DIAGNOSIS — R102 Pelvic and perineal pain: Secondary | ICD-10-CM | POA: Diagnosis not present

## 2021-05-11 DIAGNOSIS — D259 Leiomyoma of uterus, unspecified: Secondary | ICD-10-CM | POA: Insufficient documentation

## 2021-05-11 DIAGNOSIS — F419 Anxiety disorder, unspecified: Secondary | ICD-10-CM | POA: Diagnosis not present

## 2021-05-11 DIAGNOSIS — D251 Intramural leiomyoma of uterus: Secondary | ICD-10-CM | POA: Insufficient documentation

## 2021-05-11 DIAGNOSIS — D250 Submucous leiomyoma of uterus: Secondary | ICD-10-CM | POA: Insufficient documentation

## 2021-05-11 NOTE — Patient Instructions (Addendum)
It was a pleasure meeting you today.  I will plan to see you back for a visit in clinic after we get your MRI to discuss final plans for surgery.  You and Lenna Sciara will review dates today and tentatively pick a date for surgery sometime in November.  The plan for whether we will proceed with robotic surgery versus a larger incision will be made after we get your MRI results.  Please call the office at [phone] when you have decided on a surgery date. Available dates include November 8, November 15, November 17, November 22, or June 30, 2021.

## 2021-05-11 NOTE — Progress Notes (Signed)
Gynecologic Oncology Return Clinic Visit  05/11/21  Reason for Visit: surgery planning  Treatment History: The patient has had a 30 cm fibroid uterus for several years, possibly 8 years.  She desires definitive management for this as it is associated with some bulk symptoms and she perceives that it increases and decreases in size based on her diet.   The patient began having some work-up for chest pain in the summer 2020 this included a CT scan of the chest which was negative for PE but did capture the upper aspect of an abdominopelvic mass.  As part of her chest pain work-up she was seen by Dr. And who recommended a stress test.  She did not desire the perfusion stress test and instead hope to optimize her ambulatory skills in order to undergo a exercise stress test.  She is planning to reinitiate this in January 2021.   To follow-up the development of the abdominopelvic mass and the possible subtle increase in size she followed up with Dr. Landry Mellow.  Dr. Landry Mellow performed an ultrasound scan on May 15, 2019 which revealed a pelvic mass seen well above the umbilicus, unable to measure due to size, unable to completely visualize the entire mass.  It approximately measures 30 x 23.3 x 14.8 cm.   A Pap test was performed on 05/03/2017 which had been normal with negative vaginitis screen.  Patient was initially seen in late 2020 for discussion of surgical management.  She was seen again in April for possible surgery.  She saw cardiology in May and was asymptomatic and deemed low risk on exercise tolerance test.  Secondary to a move and finding care for her disabled daughter, the patient ultimately canceled surgery scheduled in June.  She saw Dr. Denman George again in July and at that time voiced having significant anxiety issues around surgery given her negative prior experience during her daughter's birth, which was an emergent cesarean section.  Interval History: Patient presents today to discuss the idea of  surgery again.  She has delayed surgery multiple times, first due to the pandemic, then secondary to a move, and most recently secondary to having significant anxiety around surgery.  She has now seen a therapist and has undergone treatment for her anxiety and is ready to move forward with plans for surgery.  She continues to have constant pelvic pressure.  At times, she will have pain, for instance if she lays on her left side.  She also has intermittent back pain and leg cramps.  Denies any vaginal bleeding.  Past Medical/Surgical History: Past Medical History:  Diagnosis Date   Abdominal mass 04/2019   CT scan    Allergic rhinitis    Atherosclerosis of aorta (HCC)    Atherosclerosis of coronary artery    Fibroids    Fibroids    Hypertension    Lung nodules 04/2019   Migraines    PTSD (post-traumatic stress disorder)    Sinus tachycardia    Uterine fibroid     Past Surgical History:  Procedure Laterality Date   ADENOIDECTOMY     CESAREAN SECTION     TONSILLECTOMY      Family History  Problem Relation Age of Onset   COPD Mother    Cancer Sister        Breast Cancer    Social History   Socioeconomic History   Marital status: Married    Spouse name: Not on file   Number of children: Not on file   Years of  education: Not on file   Highest education level: Not on file  Occupational History   Not on file  Tobacco Use   Smoking status: Never   Smokeless tobacco: Never  Vaping Use   Vaping Use: Never used  Substance and Sexual Activity   Alcohol use: Never   Drug use: Never   Sexual activity: Yes  Other Topics Concern   Not on file  Social History Narrative   Not on file   Social Determinants of Health   Financial Resource Strain: Not on file  Food Insecurity: Not on file  Transportation Needs: Not on file  Physical Activity: Not on file  Stress: Not on file  Social Connections: Not on file    Current Medications:  Current Outpatient Medications:     albuterol (PROVENTIL HFA;VENTOLIN HFA) 108 (90 Base) MCG/ACT inhaler, Inhale 1 puff into the lungs every 6 (six) hours as needed for wheezing or shortness of breath., Disp: , Rfl:    aspirin-acetaminophen-caffeine (EXCEDRIN MIGRAINE) 250-250-65 MG tablet, Take by mouth every 6 (six) hours as needed for headache., Disp: , Rfl:    famotidine (PEPCID) 20 MG tablet, Take 20 mg by mouth 2 (two) times daily., Disp: , Rfl:    fexofenadine (ALLEGRA) 180 MG tablet, Take 180 mg by mouth daily., Disp: , Rfl:    fluticasone (FLONASE) 50 MCG/ACT nasal spray, Place 1 spray into both nostrils daily., Disp: , Rfl:    ibuprofen (ADVIL) 200 MG tablet, Take 400 mg by mouth every 6 (six) hours as needed., Disp: , Rfl:    Olopatadine HCl 0.2 % SOLN, Apply 1 drop to eye daily., Disp: , Rfl:    rizatriptan (MAXALT-MLT) 10 MG disintegrating tablet, Take 10 mg by mouth as needed. , Disp: , Rfl:    sodium fluoride (PREVIDENT 5000 PLUS) 1.1 % CREA dental cream, Place 1 application onto teeth in the morning and at bedtime., Disp: , Rfl:   Review of Systems: Denies appetite changes, fevers, chills, fatigue, unexplained weight changes. Denies hearing loss, neck lumps or masses, mouth sores, ringing in ears or voice changes. Denies cough or wheezing.  Denies shortness of breath. Denies chest pain or palpitations. Denies leg swelling. Denies abdominal distention, pain, blood in stools, constipation, diarrhea, nausea, vomiting, or early satiety. Denies pain with intercourse, dysuria, frequency, hematuria or incontinence. Denies hot flashes, vaginal bleeding or vaginal discharge.   Denies joint pain, or muscle pain/cramps. Denies itching, rash, or wounds. Denies dizziness, headaches, numbness or seizures. Denies swollen lymph nodes or glands, denies easy bruising or bleeding. Denies anxiety, depression, confusion, or decreased concentration.  Physical Exam: BP (!) 151/73 (BP Location: Right Arm, Patient Position: Sitting)    Pulse 67   Temp 98.2 F (36.8 C) (Oral)   Resp 16   Ht 5\' 3"  (1.6 m)   Wt 145 lb 9.6 oz (66 kg)   SpO2 100%   BMI 25.79 kg/m  General: Alert, oriented, no acute distress. HEENT: Normocephalic, atraumatic, sclera anicteric. Chest: Clear to auscultation bilaterally.  Labored breathing on room air. Cardiovascular: Regular rate and rhythm, no murmurs. Abdomen: Large firm mass filling most of the pelvis and 4-6 cm above the umbilicus, minimally mobile.  Abdomen nontender.  Normoactive bowel sounds.  Well-healed midline incision with an approximately 3-4 cm incisional hernia noted just below the umbilicus. Extremities: Grossly normal range of motion.  Warm, well perfused.  No edema bilaterally. Skin: No rashes or lesions noted. GU: Normal appearing external genitalia without erythema, excoriation, or lesions.  Bimanual exam reveals large fibroid uterus extending 6+ centimeters above the umbilicus, mobility limited by weight, more narrow lower uterine segment, no parametrial nodularity.    Laboratory & Radiologic Studies: MRI from November, 2020 showed a uterus measuring 27.0 x 14.9 by 18.7 cm (volume = 3940 cm^3). A dominant large intramural fibroid is seen occupying nearly the entire uterine corpus and fundus which measures 22.6 x 14.9 x 18.7cm. The enlarged uterus extends into the mid abdomen, just to the left of midline. A 1.2 cm intramural fibroid is also seen in the right lateral lower uterine segment. There was a 2.2cm left ovarian cyst. There was left hydroureter from compression.  Assessment & Plan: Jane Price is a 65 y.o. woman with large fibroid uterus.  The patient has had extensive discussion previously with Dr. Denman George about undergoing definitive surgery.  I think it is reasonable to proceed with attempt at robotic total hysterectomy.  I agree that given length of time since her last MRI, repeating MRI imaging prior to surgery would be beneficial.  We will get this scheduled in the  next few weeks.  If it appears possible to proceed with robotic surgery, the patient understands that she will require a mini laparotomy for specimen delivery given the size of her uterus.  She has a vertical midline incision with hernia, that could be repaired primarily.  I offered her the option of seeing [place] surgery for a joint procedure.  Patient understands that this would likely delay her surgery until after the holidays given difficulty with coordination of surgery between 2 different services.  Would like to proceed as we have discussed with total hysterectomy, bilateral salpingectomy, and primary repair of her hernia.  We discussed several dates in mid November to help the patient with scheduling.  She would like to return after her MRI to have another discussion in person and to do her preoperative visit at that time.  38 minutes of total time was spent for this patient encounter, including preparation, face-to-face counseling with the patient and coordination of care, and documentation of the encounter.  Jeral Pinch, MD  Division of Gynecologic Oncology  Department of Obstetrics and Gynecology  [facility] [place] [facility]

## 2021-05-12 DIAGNOSIS — F411 Generalized anxiety disorder: Secondary | ICD-10-CM | POA: Diagnosis not present

## 2021-05-18 ENCOUNTER — Ambulatory Visit (HOSPITAL_COMMUNITY): Payer: BC Managed Care – PPO

## 2021-05-18 ENCOUNTER — Ambulatory Visit: Payer: BC Managed Care – PPO | Admitting: Gynecologic Oncology

## 2021-05-19 ENCOUNTER — Telehealth: Payer: Self-pay

## 2021-05-19 NOTE — Telephone Encounter (Signed)
Jane Price cancelled her MRI and wants to cancel follow up appointment for 05-20-21 with Dr. Berline Lopes. She does not have her new medicare card.  It will take 2-4 weeks to receive the card. Pt will r/s MRI and follow up appointment once she receives her new Medicare card .   Appointments cancelled.

## 2021-05-20 ENCOUNTER — Ambulatory Visit: Payer: BC Managed Care – PPO | Admitting: Gynecologic Oncology

## 2021-05-26 DIAGNOSIS — F411 Generalized anxiety disorder: Secondary | ICD-10-CM | POA: Diagnosis not present

## 2021-06-02 DIAGNOSIS — F411 Generalized anxiety disorder: Secondary | ICD-10-CM | POA: Diagnosis not present

## 2021-07-06 ENCOUNTER — Telehealth: Payer: Self-pay | Admitting: *Deleted

## 2021-07-06 NOTE — Telephone Encounter (Signed)
Returned the patient and the patient stated "I lifted a box yesterday and my hernia pushed out. I lied down and eventually It was able to go back in. The pain is always a dull constant aching pain. But it has been worse since last night. I took ASA for a headache, but that didn't help the pain. Heat does seem to help the pain. Do I need to come see Dr Berline Lopes about this or someone else? Dr Berline Lopes also talked about doing a scan. I have my insurance correct now and have cards."

## 2021-07-07 NOTE — Telephone Encounter (Signed)
Called Jane Price and asked if she has seen [facility] regarding her hernia.  She said she has not but would like to and would also like to schedule the joint surgery with Dr. Berline Lopes hopefully after Christmas.  Advised her it may take some time to arrange the joint surgery.  She is also aware that she will need an MRI and a preop appointment with Dr. Berline Lopes.  Advised her that we will work on scheduling her appointments and will call her back.   Also discussed her hernia pain.  She said she realizes now that she always has a dull pain from her hernia.  She said it is better now than when she lifted the box.  Advised her to go to the ER if the pain becomes worse.

## 2021-07-07 NOTE — Telephone Encounter (Signed)
Referral faxed to [facility] 

## 2021-07-08 ENCOUNTER — Other Ambulatory Visit: Payer: Self-pay | Admitting: Gynecologic Oncology

## 2021-07-08 DIAGNOSIS — D251 Intramural leiomyoma of uterus: Secondary | ICD-10-CM

## 2021-07-08 DIAGNOSIS — D252 Subserosal leiomyoma of uterus: Secondary | ICD-10-CM

## 2021-07-09 ENCOUNTER — Telehealth: Payer: Self-pay | Admitting: Oncology

## 2021-07-09 NOTE — Telephone Encounter (Signed)
Called Jane Price and notified her of MRI appointment on 07/14/21 at 7 pm at [facility].  Also advised her that a referral has been sent to [facility].    Also discussed her insurance coverage.  [employer] is her primary and she has Medicare part A and B.  Advised we will call back with an appointment to see Dr. Berline Lopes.

## 2021-07-10 ENCOUNTER — Telehealth: Payer: Self-pay

## 2021-07-10 NOTE — Telephone Encounter (Signed)
Returning call to Jane Price. Patient inquiring if she can proceed with hysterectomy prior to her evaluation with [facility] for her hernia. Patient reports her hernia is causing her more discomfort and she is ready to proceed with surgery. MRI is scheduled for 07/14/21 and appointment with [facility] is on 07/23/21. Instructed patient that the MRI will provide more information on the hernia. Our office will contact her to discuss MRI results once they have been reviewed. Appointment scheduled with Jane Price 08/07/21 to discuss surgery. Patient is in agreement of appointment date and time. Advised patient to go to the ER if her hernia pain becomes worse. Patient verbalized understanding.

## 2021-07-12 ENCOUNTER — Emergency Department (HOSPITAL_COMMUNITY)
Admission: EM | Admit: 2021-07-12 | Discharge: 2021-07-12 | Disposition: A | Discharge status: Home / Self Care | Payer: BC Managed Care – PPO | Attending: Emergency Medicine | Admitting: Emergency Medicine

## 2021-07-12 ENCOUNTER — Emergency Department (HOSPITAL_COMMUNITY): Payer: BC Managed Care – PPO

## 2021-07-12 ENCOUNTER — Other Ambulatory Visit: Payer: Self-pay

## 2021-07-12 ENCOUNTER — Encounter (HOSPITAL_COMMUNITY): Payer: Self-pay

## 2021-07-12 DIAGNOSIS — I10 Essential (primary) hypertension: Secondary | ICD-10-CM | POA: Insufficient documentation

## 2021-07-12 DIAGNOSIS — D259 Leiomyoma of uterus, unspecified: Secondary | ICD-10-CM | POA: Insufficient documentation

## 2021-07-12 DIAGNOSIS — R109 Unspecified abdominal pain: Secondary | ICD-10-CM | POA: Diagnosis not present

## 2021-07-12 DIAGNOSIS — K439 Ventral hernia without obstruction or gangrene: Secondary | ICD-10-CM | POA: Diagnosis not present

## 2021-07-12 LAB — URINALYSIS, ROUTINE W REFLEX MICROSCOPIC
Bilirubin Urine: NEGATIVE
Glucose, UA: NEGATIVE mg/dL
Ketones, ur: NEGATIVE mg/dL
Nitrite: NEGATIVE
Protein, ur: NEGATIVE mg/dL
Specific Gravity, Urine: 1.003 — ABNORMAL LOW (ref 1.005–1.030)
pH: 7 (ref 5.0–8.0)

## 2021-07-12 LAB — COMPREHENSIVE METABOLIC PANEL
ALT: 18 U/L (ref 0–44)
AST: 23 U/L (ref 15–41)
Albumin: 4.3 g/dL (ref 3.5–5.0)
Alkaline Phosphatase: 75 U/L (ref 38–126)
Anion gap: 9 (ref 5–15)
BUN: 12 mg/dL (ref 8–23)
CO2: 24 mmol/L (ref 22–32)
Calcium: 9.2 mg/dL (ref 8.9–10.3)
Chloride: 103 mmol/L (ref 98–111)
Creatinine, Ser: 0.72 mg/dL (ref 0.44–1.00)
GFR, Estimated: >60 mL/min (ref >60)
Glucose, Bld: 105 mg/dL — ABNORMAL HIGH (ref 70–99)
Potassium: 3.7 mmol/L (ref 3.5–5.1)
Sodium: 136 mmol/L (ref 135–145)
Total Bilirubin: 0.6 mg/dL (ref 0.3–1.2)
Total Protein: 7 g/dL (ref 6.5–8.1)

## 2021-07-12 LAB — CBC WITH DIFFERENTIAL/PLATELET
Abs Immature Granulocytes: 0.02 10*3/uL (ref 0.00–0.07)
Basophils Absolute: 0.1 10*3/uL (ref 0.0–0.1)
Basophils Relative: 1 %
Eosinophils Absolute: 0.2 10*3/uL (ref 0.0–0.5)
Eosinophils Relative: 3 %
HCT: 35.3 % — ABNORMAL LOW (ref 36.0–46.0)
Hemoglobin: 11.6 g/dL — ABNORMAL LOW (ref 12.0–15.0)
Immature Granulocytes: 0 %
Lymphocytes Relative: 23 %
Lymphs Abs: 1.5 10*3/uL (ref 0.7–4.0)
MCH: 28.6 pg (ref 26.0–34.0)
MCHC: 32.9 g/dL (ref 30.0–36.0)
MCV: 86.9 fL (ref 80.0–100.0)
Monocytes Absolute: 0.5 10*3/uL (ref 0.1–1.0)
Monocytes Relative: 8 %
Neutro Abs: 4.2 10*3/uL (ref 1.7–7.7)
Neutrophils Relative %: 65 %
Platelets: 332 10*3/uL (ref 150–400)
RBC: 4.06 MIL/uL (ref 3.87–5.11)
RDW: 14.3 % (ref 11.5–15.5)
WBC: 6.5 10*3/uL (ref 4.0–10.5)
nRBC: 0 % (ref 0.0–0.2)

## 2021-07-12 MED ORDER — HYDROCODONE-ACETAMINOPHEN 5-325 MG PO TABS: 1.0000 | ORAL_TABLET | Freq: Four times a day (QID) | ORAL | 0 refills | Status: DC | PRN

## 2021-07-12 MED ORDER — IOHEXOL 350 MG/ML SOLN
80.0000 mL | Freq: Once | INTRAVENOUS | Status: AC | PRN
  Administered 2021-07-12: 80 mL via INTRAVENOUS

## 2021-07-12 MED ORDER — MORPHINE SULFATE (PF) 4 MG/ML IV SOLN
4.0000 mg | Freq: Once | INTRAVENOUS | Status: AC
  Administered 2021-07-12: 4 mg via INTRAVENOUS
  Filled 2021-07-12: qty 1

## 2021-07-12 NOTE — ED Triage Notes (Signed)
Pt reports worsening abdominal pain from hernia x1 week after lifting boxes. She also endorses some mild nausea. Denies V/D.

## 2021-07-12 NOTE — Discharge Instructions (Signed)
Return for any problem.  ?

## 2021-07-12 NOTE — ED Provider Notes (Signed)
[facility] DEPT Provider Note   CSN: [id number] Arrival date & time: 07/12/21  1725     History No chief complaint on file.   Jane Price is a 65 y.o. female.  65 year old female with prior medical history as detailed below presents for evaluation.  Patient reports longstanding history of ventral abdominal hernia.  Patient reports that over the last week she has noted increased pain to her anterior abdomen.  She reports feeling a small bulge in her abdomen at midline.  She reports that her midline abdominal discomfort started after heavy lifting 1 week prior.  She denies nausea or vomiting.  She denies change in her bowel movement.  She is still passing gas.  Her last bowel movement was earlier today.    The history is provided by the patient.  Illness Location:  Ventral hernia, abdominal pain Severity:  Mild Onset quality:  Gradual Duration:  1 week Timing:  Intermittent Progression:  Unchanged Chronicity:  New     Past Medical History:  Diagnosis Date   Abdominal mass 04/2019   CT scan    Allergic rhinitis    Atherosclerosis of aorta (HCC)    Atherosclerosis of coronary artery    Fibroids    Fibroids    Hypertension    Lung nodules 04/2019   Migraines    PTSD (post-traumatic stress disorder)    Sinus tachycardia    Uterine fibroid     Patient Active Problem List   Diagnosis Date Noted   Intramural, submucous, and subserous leiomyoma of uterus 05/11/2021   Ventral hernia 11/29/2019    Past Surgical History:  Procedure Laterality Date   ADENOIDECTOMY     CESAREAN SECTION     TONSILLECTOMY       OB History   No obstetric history on file.     Family History  Problem Relation Age of Onset   COPD Mother    Cancer Sister        Breast Cancer    Social History   Tobacco Use   Smoking status: Never   Smokeless tobacco: Never  Vaping Use   Vaping Use: Never used  Substance Use Topics   Alcohol use: Never    Drug use: Never    Home Medications Prior to Admission medications   Medication Sig Start Date End Date Taking? Authorizing Provider  albuterol (PROVENTIL HFA;VENTOLIN HFA) 108 (90 Base) MCG/ACT inhaler Inhale 1 puff into the lungs every 6 (six) hours as needed for wheezing or shortness of breath.    [provider]  aspirin-acetaminophen-caffeine (EXCEDRIN MIGRAINE) [phone] MG tablet Take by mouth every 6 (six) hours as needed for headache.    [provider]  famotidine (PEPCID) 20 MG tablet Take 20 mg by mouth 2 (two) times daily.    [provider]  fexofenadine (ALLEGRA) 180 MG tablet Take 180 mg by mouth daily.    [provider]  fluticasone (FLONASE) 50 MCG/ACT nasal spray Place 1 spray into both nostrils daily.    [provider]  ibuprofen (ADVIL) 200 MG tablet Take 400 mg by mouth every 6 (six) hours as needed.    [provider]  Olopatadine HCl 0.2 % SOLN Apply 1 drop to eye daily.    [provider]  rizatriptan (MAXALT-MLT) 10 MG disintegrating tablet Take 10 mg by mouth as needed.     [provider]  sodium fluoride (PREVIDENT 5000 PLUS) 1.1 % CREA dental cream Place 1 application onto teeth  in the morning and at bedtime. 01/07/21   [provider]    Allergies    Erythromycin base, Other, Ciprofloxacin, Doxycycline, Metronidazole, Nasacort [triamcinolone], Tobramycin, Penicillins, and Sulfamethoxazole  Review of Systems   Review of Systems  All other systems reviewed and are negative.  Physical Exam Updated Vital Signs BP (!) 151/83 (BP Location: Right Arm)   Pulse 66   Temp 98.5 F (36.9 C) (Oral)   Resp 18   SpO2 99%   Physical Exam Vitals and nursing note reviewed.  Constitutional:      General: She is not in acute distress.    Appearance: Normal appearance. She is well-developed.  HENT:     Head: Normocephalic and atraumatic.  Eyes:     Conjunctiva/sclera: Conjunctivae  normal.     Pupils: Pupils are equal, round, and reactive to light.  Cardiovascular:     Rate and Rhythm: Normal rate and regular rhythm.     Heart sounds: Normal heart sounds.  Pulmonary:     Effort: Pulmonary effort is normal. No respiratory distress.     Breath sounds: Normal breath sounds.  Abdominal:     General: There is no distension.     Palpations: Abdomen is soft.     Tenderness: There is no abdominal tenderness.     Comments: Ventral hernia present.  Partially reducible herniation present on exam.  Musculoskeletal:        General: No deformity. Normal range of motion.     Cervical back: Normal range of motion and neck supple.  Skin:    General: Skin is warm and dry.  Neurological:     General: No focal deficit present.     Mental Status: She is alert and oriented to person, place, and time.    ED Results / Procedures / Treatments   Labs (all labs ordered are listed, but only abnormal results are displayed) Labs Reviewed  COMPREHENSIVE METABOLIC PANEL - Abnormal; Notable for the following components:      Result Value   Glucose, Bld 105 (*)    All other components within normal limits  CBC WITH DIFFERENTIAL/PLATELET - Abnormal; Notable for the following components:   Hemoglobin 11.6 (*)    HCT 35.3 (*)    All other components within normal limits  URINALYSIS, ROUTINE W REFLEX MICROSCOPIC - Abnormal; Notable for the following components:   Color, Urine COLORLESS (*)    Specific Gravity, Urine 1.003 (*)    Hgb urine dipstick SMALL (*)    Leukocytes,Ua TRACE (*)    Bacteria, UA RARE (*)    All other components within normal limits    EKG None  Radiology No results found.  Procedures Procedures   Medications Ordered in ED Medications  morphine 4 MG/ML injection 4 mg (has no administration in time range)    ED Course  I have reviewed the triage vital signs and the nursing notes.  Pertinent labs & imaging results that were available during my care of  the patient were reviewed by me and considered in my medical decision making (see chart for details).    MDM Rules/Calculators/A&P                           MDM  MSE complete  Jane Price was evaluated in Emergency Department on 07/12/2021 for the symptoms described in the history of present illness. She was evaluated in the context of the global COVID-19 pandemic, which necessitated consideration that  the patient might be at risk for infection with the SARS-CoV-2 virus that causes COVID-19. Institutional protocols and algorithms that pertain to the evaluation of patients at risk for COVID-19 are in a state of rapid change based on information released by regulatory bodies including the CDC and federal and state organizations. These policies and algorithms were followed during the patient's care in the ED.  Patient with longstanding history of ventral hernia.  She also has longstanding history of enlarged uterus secondary to large fibroids.  Patient reports that 1 week ago she had some heavy lifting which caused her midline abdomen to feel "stretched."  She is denying any symptoms suggestive of obstruction.  Exam is demonstrative of ventral hernia.  Doubt incarcerated bowel.  CT imaging obtained without evidence of bowel incarceration.  No other acute pathology identified on CT from today.  Patient does feel improved after her ED evaluation.  She does understand need for close follow-up.  She does have a plan in place for hernia repair and removal of her uterus next month.  Importance of close follow-up is stressed.  Strict return precautions given understood.  Final Clinical Impression(s) / ED Diagnoses Final diagnoses:  Ventral hernia without obstruction or gangrene    Rx / DC Orders ED Discharge Orders          Ordered    HYDROcodone-acetaminophen (NORCO/VICODIN) 5-325 MG tablet  Every 6 hours PRN        07/12/21 2043             Valarie Merino, MD 07/12/21  2050

## 2021-07-14 ENCOUNTER — Ambulatory Visit (HOSPITAL_COMMUNITY): Payer: Medicare Other

## 2021-07-14 ENCOUNTER — Telehealth: Payer: Self-pay | Admitting: Oncology

## 2021-07-14 NOTE — Telephone Encounter (Signed)
Called Jane Price and let her know that Dr. Berline Lopes has reviewed her CT scan from 07/12/21 and said that she does not need the MRI that is scheduled today.  Advised her that we will cancel the MRI.  She verbalized understanding and agreement.

## 2021-07-16 DIAGNOSIS — T7840XA Allergy, unspecified, initial encounter: Secondary | ICD-10-CM | POA: Diagnosis not present

## 2021-07-16 DIAGNOSIS — R233 Spontaneous ecchymoses: Secondary | ICD-10-CM | POA: Diagnosis not present

## 2021-07-23 DIAGNOSIS — K439 Ventral hernia without obstruction or gangrene: Secondary | ICD-10-CM | POA: Diagnosis not present

## 2021-08-06 DIAGNOSIS — Z85828 Personal history of other malignant neoplasm of skin: Secondary | ICD-10-CM | POA: Diagnosis not present

## 2021-08-06 DIAGNOSIS — D485 Neoplasm of uncertain behavior of skin: Secondary | ICD-10-CM | POA: Diagnosis not present

## 2021-08-06 DIAGNOSIS — L918 Other hypertrophic disorders of the skin: Secondary | ICD-10-CM | POA: Diagnosis not present

## 2021-08-06 DIAGNOSIS — R239 Unspecified skin changes: Secondary | ICD-10-CM | POA: Diagnosis not present

## 2021-08-06 DIAGNOSIS — D2262 Melanocytic nevi of left upper limb, including shoulder: Secondary | ICD-10-CM | POA: Diagnosis not present

## 2021-08-06 DIAGNOSIS — D225 Melanocytic nevi of trunk: Secondary | ICD-10-CM | POA: Diagnosis not present

## 2021-08-07 ENCOUNTER — Other Ambulatory Visit: Payer: Self-pay

## 2021-08-07 ENCOUNTER — Inpatient Hospital Stay: Payer: BC Managed Care – PPO | Attending: Gynecologic Oncology | Admitting: Gynecologic Oncology

## 2021-08-07 ENCOUNTER — Encounter: Payer: Self-pay | Admitting: Gynecologic Oncology

## 2021-08-07 VITALS — BP 173/70 | HR 81 | Temp 98.1°F | Resp 18 | Ht 63.0 in | Wt 148.6 lb

## 2021-08-07 DIAGNOSIS — I2510 Atherosclerotic heart disease of native coronary artery without angina pectoris: Secondary | ICD-10-CM | POA: Diagnosis not present

## 2021-08-07 DIAGNOSIS — D252 Subserosal leiomyoma of uterus: Secondary | ICD-10-CM

## 2021-08-07 DIAGNOSIS — R000 Tachycardia, unspecified: Secondary | ICD-10-CM | POA: Insufficient documentation

## 2021-08-07 DIAGNOSIS — K439 Ventral hernia without obstruction or gangrene: Secondary | ICD-10-CM | POA: Diagnosis not present

## 2021-08-07 DIAGNOSIS — I10 Essential (primary) hypertension: Secondary | ICD-10-CM | POA: Insufficient documentation

## 2021-08-07 DIAGNOSIS — D250 Submucous leiomyoma of uterus: Secondary | ICD-10-CM

## 2021-08-07 DIAGNOSIS — D259 Leiomyoma of uterus, unspecified: Secondary | ICD-10-CM | POA: Diagnosis not present

## 2021-08-07 DIAGNOSIS — Z79899 Other long term (current) drug therapy: Secondary | ICD-10-CM | POA: Insufficient documentation

## 2021-08-07 DIAGNOSIS — I700 Atherosclerosis of aorta: Secondary | ICD-10-CM | POA: Insufficient documentation

## 2021-08-07 DIAGNOSIS — D251 Intramural leiomyoma of uterus: Secondary | ICD-10-CM

## 2021-08-07 NOTE — Patient Instructions (Addendum)
We will place a referral for you to meet with the interventional radiologist to discuss the procedure of uterine artery embolization. If you decide to proceed, this would need to take place the day before surgery. We are also working with Dr. Robby Sermon office to schedule your surgery.    We will arrange for a pre-operative appointment in the office with Joylene John, NP before the surgery to discuss the surgery in detail in regards to what to expect before and after.

## 2021-08-07 NOTE — Progress Notes (Signed)
Gynecologic Oncology Return Clinic Visit  08/07/21  Reason for Visit: surgery planning  Treatment History: The patient has had a 30 cm fibroid uterus for several years, possibly 8 years.  She desires definitive management for this as it is associated with some bulk symptoms and she perceives that it increases and decreases in size based on her diet.   The patient began having some work-up for chest pain in the summer 2020 this included a CT scan of the chest which was negative for PE but did capture the upper aspect of an abdominopelvic mass.  As part of her chest pain work-up she was seen by Dr. And who recommended a stress test.  She did not desire the perfusion stress test and instead hope to optimize her ambulatory skills in order to undergo a exercise stress test.  She is planning to reinitiate this in January 2021.   To follow-up the development of the abdominopelvic mass and the possible subtle increase in size she followed up with Dr. Landry Mellow.  Dr. Landry Mellow performed an ultrasound scan on May 15, 2019 which revealed a pelvic mass seen well above the umbilicus, unable to measure due to size, unable to completely visualize the entire mass.  It approximately measures 30 x 23.3 x 14.8 cm.   A Pap test was performed on 05/03/2017 which had been normal with negative vaginitis screen.   Patient was initially seen in late 2020 for discussion of surgical management.  She was seen again in April for possible surgery.  She saw cardiology in May and was asymptomatic and deemed low risk on exercise tolerance test.  Secondary to a move and finding care for her disabled daughter, the patient ultimately canceled surgery scheduled in June.  She saw Dr. Denman George again in July and at that time voiced having significant anxiety issues around surgery given her negative prior experience during her daughter's birth, which was an emergent cesarean section.  The patient saw me in October, was feeling ready to discuss moving  forward with surgery.  Interval History: Since her last visit, the patient presented to the emergency department on 12/11 with 1 week of increased abdominal pain after doing heavy lifting.  She received pain medication while in the emergency department and felt significantly better.  She denies any significant abdominal pain since then.  She denies any vaginal bleeding.  She reports regular bowel and bladder function.  She has also met with one of the general surgeons, Dr. Thermon Leyland, about undergoing concurrent hernia repair at the time of her hysterectomy.  She had a very good visit with him and feels comfortable moving forward with scheduling the joint surgery.  She has had multiple episodes of allergies or allergic reactions in the last few months.  She is somewhat concerned about having allergies to medication at the time of surgery.  Past Medical/Surgical History: Past Medical History:  Diagnosis Date   Abdominal mass 04/2019   CT scan    Allergic rhinitis    Atherosclerosis of aorta (HCC)    Atherosclerosis of coronary artery    Fibroids    Fibroids    Hypertension    Lung nodules 04/2019   Migraines    PTSD (post-traumatic stress disorder)    Sinus tachycardia    Uterine fibroid     Past Surgical History:  Procedure Laterality Date   ADENOIDECTOMY     CESAREAN SECTION     TONSILLECTOMY      Family History  Problem Relation Age of Onset  COPD Mother    Cancer Sister        Breast Cancer    Social History   Socioeconomic History   Marital status: Married    Spouse name: Not on file   Number of children: Not on file   Years of education: Not on file   Highest education level: Not on file  Occupational History   Not on file  Tobacco Use   Smoking status: Never   Smokeless tobacco: Never  Vaping Use   Vaping Use: Never used  Substance and Sexual Activity   Alcohol use: Never   Drug use: Never   Sexual activity: Yes  Other Topics Concern   Not on file   Social History Narrative   Not on file   Social Determinants of Health   Financial Resource Strain: Not on file  Food Insecurity: Not on file  Transportation Needs: Not on file  Physical Activity: Not on file  Stress: Not on file  Social Connections: Not on file    Current Medications:  Current Outpatient Medications:    albuterol (PROVENTIL HFA;VENTOLIN HFA) 108 (90 Base) MCG/ACT inhaler, Inhale 1 puff into the lungs every 6 (six) hours as needed for wheezing or shortness of breath., Disp: , Rfl:    aspirin-acetaminophen-caffeine (EXCEDRIN MIGRAINE) 250-250-65 MG tablet, Take by mouth every 6 (six) hours as needed for headache., Disp: , Rfl:    famotidine (PEPCID) 20 MG tablet, Take 20 mg by mouth 2 (two) times daily., Disp: , Rfl:    fexofenadine (ALLEGRA) 180 MG tablet, Take 180 mg by mouth daily., Disp: , Rfl:    fluticasone (FLONASE) 50 MCG/ACT nasal spray, Place 1 spray into both nostrils daily., Disp: , Rfl:    ibuprofen (ADVIL) 200 MG tablet, Take 400 mg by mouth every 6 (six) hours as needed., Disp: , Rfl:    Olopatadine HCl 0.2 % SOLN, Apply 1 drop to eye daily., Disp: , Rfl:    rizatriptan (MAXALT-MLT) 10 MG disintegrating tablet, Take 10 mg by mouth as needed. , Disp: , Rfl:    sodium fluoride (PREVIDENT 5000 PLUS) 1.1 % CREA dental cream, Place 1 application onto teeth in the morning and at bedtime., Disp: , Rfl:    HYDROcodone-acetaminophen (NORCO/VICODIN) 5-325 MG tablet, Take 1 tablet by mouth every 6 (six) hours as needed. (Patient not taking: Reported on 08/07/2021), Disp: 10 tablet, Rfl: 0  Review of Systems: Denies appetite changes, fevers, chills, fatigue, unexplained weight changes. Denies hearing loss, neck lumps or masses, mouth sores, ringing in ears or voice changes. Denies cough or wheezing.  Denies shortness of breath. Denies chest pain or palpitations. Denies leg swelling. Denies abdominal distention, pain, blood in stools, constipation, diarrhea, nausea,  vomiting, or early satiety. Denies pain with intercourse, dysuria, frequency, hematuria or incontinence. Denies hot flashes, pelvic pain, vaginal bleeding or vaginal discharge.   Denies joint pain, back pain or muscle pain/cramps. Denies itching, rash, or wounds. Denies dizziness, headaches, numbness or seizures. Denies swollen lymph nodes or glands, denies easy bruising or bleeding. Denies anxiety, depression, confusion, or decreased concentration.  Physical Exam: BP (!) 173/70 (BP Location: Left Arm, Patient Position: Sitting)    Pulse 81    Temp 98.1 F (36.7 C) (Tympanic)    Resp 18    Ht _0  (1.6 m)    Wt 148 lb 9.6 oz (67.4 kg)    SpO2 100%    BMI 26.32 kg/m  General: Alert, oriented, no acute distress. HEENT: Normocephalic, atraumatic,  sclera anicteric. Chest: Clear to auscultation bilaterally.  No wheezes or rhonchi. Cardiovascular: Regular rate and rhythm, no murmurs. Extremities: Grossly normal range of motion.  Warm, well perfused.  No edema bilaterally. Skin: No rashes or lesions noted.  Laboratory & Radiologic Studies: CT A/P on 07/12/21: IMPRESSION: 1. No clear acute findings in the abdomen pelvis. 2. Massive leiomyoma of the uterine body extending into the upper abdomen. Leiomyoma volume equals 3300 cc. Minimal change from MRI evaluation 06/18/2019. 3. Subcutaneous fluid collection inferior to the umbilicus not changed from comparison MRI.  Assessment & Plan: Jane Price is a 66 y.o. woman with large fibroid uterus and ventral hernia.  The patient has now met with general surgery and is ready to move forward with planning for joint procedure.  Luckily, she has been much less symptomatic since her visit to the emergency department in December.  We discussed findings that her recent CT scan did not show significant change in size of her massive uterine fibroid.  I do not think additional imaging needs to be performed prior to proceeding with surgery.  From a  planning standpoint, I would still like to attempt to do surgery robotically with mini laparotomy for specimen removal.  Given size of her fibroid, I think it is worth considering embolization just before surgery to help decrease intraoperative blood loss.  Patient was open to hearing about this possibility as well as a referral to speak with one of the interventional radiologist about the procedure.  We discussed that this is typically done the day before the procedure with admission overnight given pain that can happen related to acute decrease in blood supply to the uterus and fibroid.  In terms of my portion of the surgery, we have previously discussed retention of bilateral ovaries.  The patient is still very much interested in keeping her ovaries.  Would plan for total hysterectomy and bilateral salpingectomy.  Patient has concerns about multiple allergies to various substances over the last few months.  I have encouraged her to talk to anesthesia about this but we reviewed today that if she were to have a reaction to her medication in the perioperative period, we would be able to treat this.  Given unknown timing of the surgery still with coordination between 3 different services, we will plan to have patient return for a preoperative visit with [facility], as well as the patient's preference.  Referral placed to interventional radiology today.  We will continue working with Dr. Robby Sermon office to coordinate timing for surgery.  42 minutes of total time was spent for this patient encounter, including preparation, face-to-face counseling with the patient and coordination of care, and documentation of the encounter.  Jeral Pinch, MD  Division of Gynecologic Oncology  Department of Obstetrics and Gynecology  [facility]

## 2021-08-10 ENCOUNTER — Telehealth: Payer: Self-pay | Admitting: Gynecologic Oncology

## 2021-08-10 NOTE — Telephone Encounter (Signed)
Called [facility] to follow up on scheduling the joint procedure with Dr. Berline Lopes and Dr. Thermon Leyland. Left a message.

## 2021-08-12 DIAGNOSIS — Z0000 Encounter for general adult medical examination without abnormal findings: Secondary | ICD-10-CM | POA: Diagnosis not present

## 2021-08-12 DIAGNOSIS — R030 Elevated blood-pressure reading, without diagnosis of hypertension: Secondary | ICD-10-CM | POA: Diagnosis not present

## 2021-08-12 DIAGNOSIS — E785 Hyperlipidemia, unspecified: Secondary | ICD-10-CM | POA: Diagnosis not present

## 2021-08-12 DIAGNOSIS — D259 Leiomyoma of uterus, unspecified: Secondary | ICD-10-CM | POA: Diagnosis not present

## 2021-08-14 ENCOUNTER — Encounter: Payer: Self-pay | Admitting: *Deleted

## 2021-08-14 ENCOUNTER — Ambulatory Visit
Admission: RE | Admit: 2021-08-14 | Discharge: 2021-08-14 | Disposition: A | Discharge status: Home / Self Care | Payer: Medicare Other | Source: Ambulatory Visit | Attending: Gynecologic Oncology | Admitting: Gynecologic Oncology

## 2021-08-14 DIAGNOSIS — D251 Intramural leiomyoma of uterus: Secondary | ICD-10-CM

## 2021-08-14 DIAGNOSIS — D252 Subserosal leiomyoma of uterus: Secondary | ICD-10-CM

## 2021-08-14 HISTORY — PX: IR RADIOLOGIST EVAL & MGMT: IMG5224

## 2021-08-14 NOTE — Progress Notes (Signed)
Chief Complaint: Patient was seen in consultation today for uterine fibroid embolization evaluation at the request of Price,Jane D  Referring Physician(s): Price,Jane D  History of Present Illness: Jane Price is a 66 y.o. female w PMHx significant for migraines, for which she takes ASA and Excedrin, and fibroid uterus. Pt reports that she is post menopausal as of age 59 and has been aware of her fibroids for 'many' years but had deferred definitive treatement because she anxious to get surgery. Recently, in December 2022, she presented to [facility] ER with abdominal discomfort and was found to have a symptomatic ventral hernia that was successfully reduced. She reports a C-section 30 yrs prior for which the incision has created a hernia from her enlarged uterus.   She endorses pelvic mass symptoms including; leg cramps from poor circulation, fullness, poor quality of sleep as she cannot lie on one (L) side and inability to take long car trips. More concerning to her is an inability to adequately care for her disabled child, who she cannot pick up to assist bc of the strain on her abdomen. She denies any postmenopausal bleeding.  She is scheduled for hysterectomy w Dr Jane Price, with same day ventral hernia repair w Dr Jane Price. Dr Jane Price referred Jane Price to [facility] to evaluate for embolization, to decreased intraoperative blood loss during hysterectomy.  Review of Systems:  A 12 point ROS discussed and pertinent positives are indicated in the HPI above.   Past Medical History:  Diagnosis Date   Abdominal mass 04/2019   CT scan    Allergic rhinitis    Atherosclerosis of aorta (HCC)    Atherosclerosis of coronary artery    Fibroids    Fibroids    Hypertension    Lung nodules 04/2019   Migraines    PTSD (post-traumatic stress disorder)    Sinus tachycardia    Uterine fibroid     Past Surgical History:  Procedure Laterality Date   ADENOIDECTOMY     CESAREAN  SECTION     IR RADIOLOGIST EVAL & MGMT  08/14/2021   TONSILLECTOMY      Allergies: Erythromycin base, Other, Ciprofloxacin, Doxycycline, Metronidazole, Nasacort [triamcinolone], Tobramycin, Penicillins, and Sulfamethoxazole  Medications: Prior to Admission medications   Medication Sig Start Date End Date Taking? Authorizing Provider  albuterol (PROVENTIL HFA;VENTOLIN HFA) 108 (90 Base) MCG/ACT inhaler Inhale 1 puff into the lungs every 6 (six) hours as needed for wheezing or shortness of breath.    [provider]  aspirin-acetaminophen-caffeine (EXCEDRIN MIGRAINE) [id number] MG tablet Take by mouth every 6 (six) hours as needed for headache.    [provider]  famotidine (PEPCID) 20 MG tablet Take 20 mg by mouth 2 (two) times daily.    [provider]  fexofenadine (ALLEGRA) 180 MG tablet Take 180 mg by mouth daily.    [provider]  fluticasone (FLONASE) 50 MCG/ACT nasal spray Place 1 spray into both nostrils daily.    [provider]  HYDROcodone-acetaminophen (NORCO/VICODIN) 5-325 MG tablet Take 1 tablet by mouth every 6 (six) hours as needed. Patient not taking: Reported on 08/07/2021 07/12/21   Valarie Merino, MD  ibuprofen (ADVIL) 200 MG tablet Take 400 mg by mouth every 6 (six) hours as needed.    [provider]  Olopatadine HCl 0.2 % SOLN Apply 1 drop to eye daily.    [provider]  rizatriptan (MAXALT-MLT) 10 MG disintegrating tablet Take 10 mg by mouth as needed.  [provider]  sodium fluoride (PREVIDENT 5000 PLUS) 1.1 % CREA dental cream Place 1 application onto teeth in the morning and at bedtime. 01/07/21   [provider]     Family History  Problem Relation Age of Onset   COPD Mother    Cancer Sister        Breast Cancer    Social History   Socioeconomic History   Marital status: Married    Spouse name: Not on file   Number of children: Not on file   Years of education: Not  on file   Highest education level: Not on file  Occupational History   Not on file  Tobacco Use   Smoking status: Never   Smokeless tobacco: Never  Vaping Use   Vaping Use: Never used  Substance and Sexual Activity   Alcohol use: Never   Drug use: Never   Sexual activity: Yes  Other Topics Concern   Not on file  Social History Narrative   Not on file   Social Determinants of Health   Financial Resource Strain: Not on file  Food Insecurity: Not on file  Transportation Needs: Not on file  Physical Activity: Not on file  Stress: Not on file  Social Connections: Not on file    Review of Systems As above  Vital Signs: BP (!) 155/76 (BP Location: Left Arm)    Pulse 62    SpO2 97%   Physical Exam  General: WD/WN, NAD  CV: RRR Pulm: normal work of breathing on RA Abd: Rotund with mass. S, NT MSK: Grossly normal Psych: Appropriate affect.  Imaging:  CT AP: 07/12/21 Independently reviewed, demonstrating a markedly enlarged fibroid uterus with internal calcification.    Labs:  CBC: Recent Labs    07/12/21 1824  WBC 6.5  HGB 11.6*  HCT 35.3*  PLT 332    COAGS: No results for input(s): INR, APTT in the last 8760 hours.  BMP: Recent Labs    07/12/21 1824  NA 136  K 3.7  CL 103  CO2 24  GLUCOSE 105*  BUN 12  CALCIUM 9.2  CREATININE 0.72  GFRNONAA >60    LIVER FUNCTION TESTS: Recent Labs    07/12/21 1824  BILITOT 0.6  AST 23  ALT 18  ALKPHOS 75  PROT 7.0  ALBUMIN 4.3     Assessment and Plan:  Assessment   Plan: Jane Price is a 66 y.o. year old female who presents with pelvic discomfort from uterine fibroids, with planned hysterectomy. Embolization requested to  minimize intraoperative blood loss.  Will plan for pre-operative UTERINE ARTERY EMBOLIZATION   The procedure has been fully reviewed with the patient/patients authorized representative. The risks, benefits and alternatives have been explained, and the  patient/patients authorized representative has consented to the procedure.  *CT AP (07/12/21) already performed and reviewed. No additional imaging required *procedure to be performed a day or two prior to hysterectomy *proceed to schedule based on mutual availability *will plan for trans-radial access, secondary to Pt habitus *Post procedure admission by GYN team for anticipated surgery   Thank you for this interesting consult.  I greatly enjoyed meeting Jane Price and look forward to participating in their care.  A copy of this report was sent to the requesting provider on this date.  Electronically Signed:  Michaelle Birks, MD [facility]   Pager. [phone] Clinic. [phone]  I spent a total of  40 Minutes  in face to  face in clinical consultation, more than half of this visit was spent counseling the patient and/or their family regarding proposed plan of care, outcomes, expectations and duration of illness.

## 2021-08-21 ENCOUNTER — Telehealth: Payer: Self-pay | Admitting: Gynecologic Oncology

## 2021-08-21 NOTE — Telephone Encounter (Signed)
Called to follow up on joint case. Left message with surgery scheduler today and earlier in the week. During last conversation, we were waiting for surgery date guidance from Dr. Thermon Leyland.

## 2021-08-27 ENCOUNTER — Other Ambulatory Visit (HOSPITAL_COMMUNITY): Payer: Self-pay | Admitting: Interventional Radiology

## 2021-08-27 DIAGNOSIS — D259 Leiomyoma of uterus, unspecified: Secondary | ICD-10-CM

## 2021-09-03 ENCOUNTER — Telehealth: Payer: Self-pay

## 2021-09-03 NOTE — Telephone Encounter (Signed)
Spoke with Ms. Connon this morning to follow up on allergy list for Joylene John, NP. Pt states for Nasacort she experiences nosebleeds and headaches.  For the penicillin allergy, she believes she has only experienced a rash. She states she has taken Keflex before and has not experienced any reactions. She does mention that her daughters have experienced reactions from taking Keflex in the past.

## 2021-09-08 IMAGING — MR MR PELVIS WO/W CM
10 of 15 series · 19 of 48 positions shown · IV contrast (Yes)
Comparison: None.

CLINICAL DATA: Markedly enlarged uterus. Uterine fibroids. Surgical
planning.

EXAM:
MRI PELVIS WITHOUT AND WITH CONTRAST
TECHNIQUE: Multiplanar multisequence MR imaging of the pelvis was performed
both before and after administration of intravenous contrast.
CONTRAST:  7.5mL GADAVIST GADOBUTROL 1 MMOL/ML IV SOLN

[Series 3: T2 · coronal · 6.0mm · 0.90mm/px · 2 of 38 slices shown (1 of 4)]
[im 1/38]
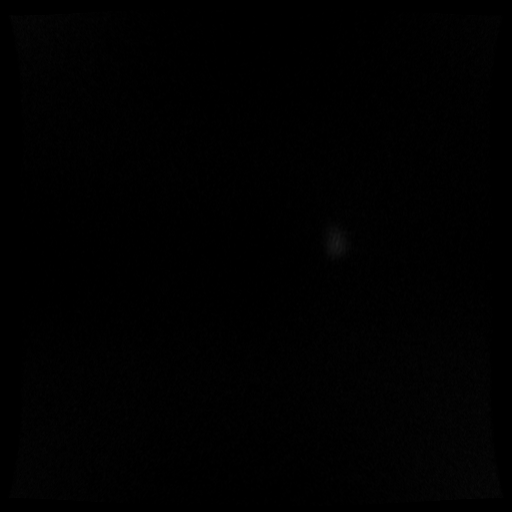
[im 38/38]
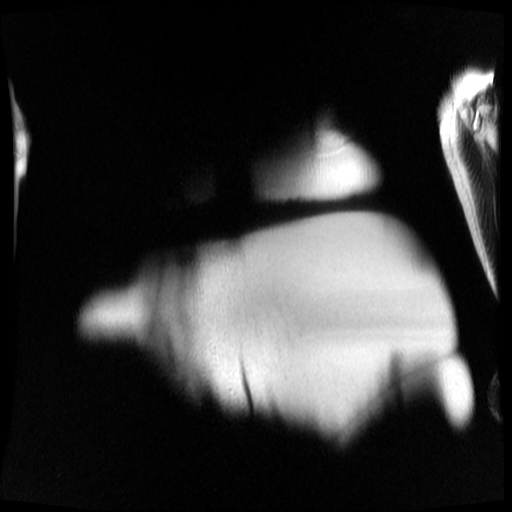

[Series 4: T2 · coronal · 5.0mm · 0.70mm/px · 2 of 37 slices shown (2 of 4)]
[im 1/37]
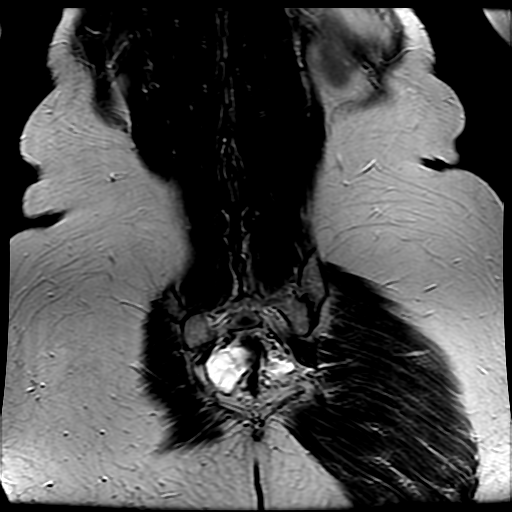
[im 37/37]
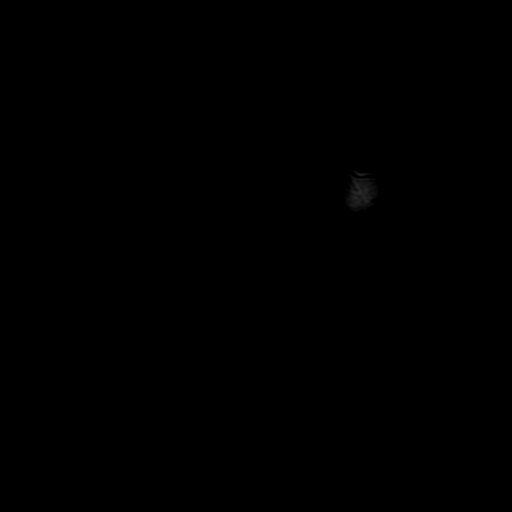

[Series 5: T2 · axial · 5.0mm · 0.62mm/px · z∈[-257,+79]mm · 3 of 57 slices shown (3 of 4)]
[im 1/57]
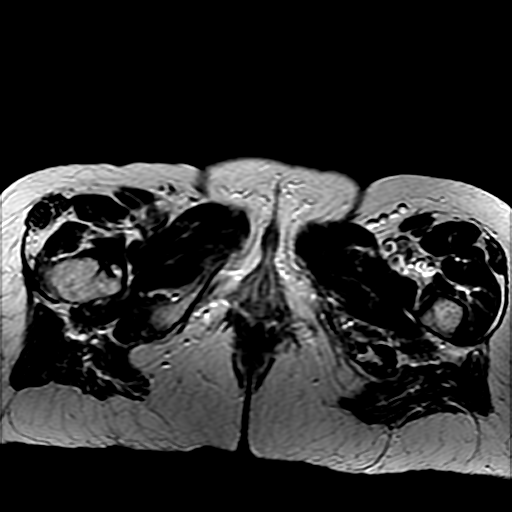
[im 29/57]
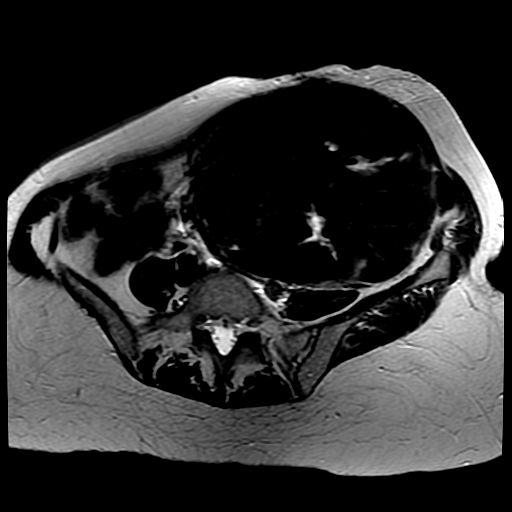
[im 57/57]
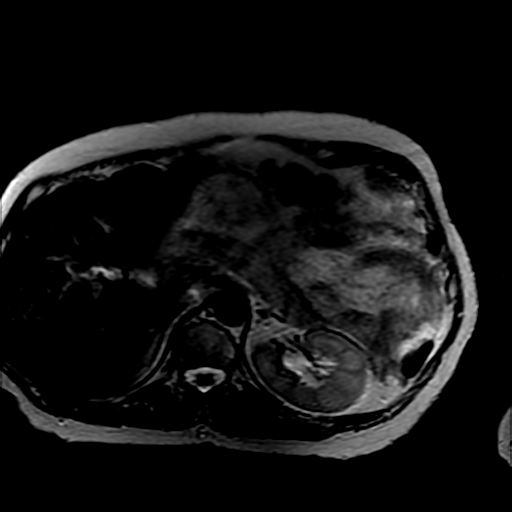

[Series 6: T2 fat-sat · axial · 5.0mm · 0.62mm/px · z∈[-257,+79]mm · 2 of 57 slices shown]
[im 1/57]
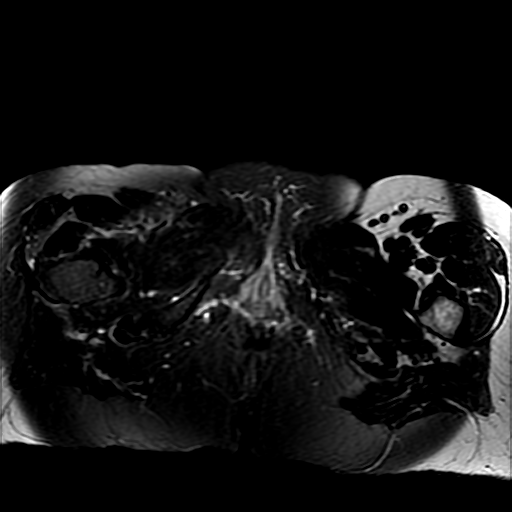
[im 57/57]
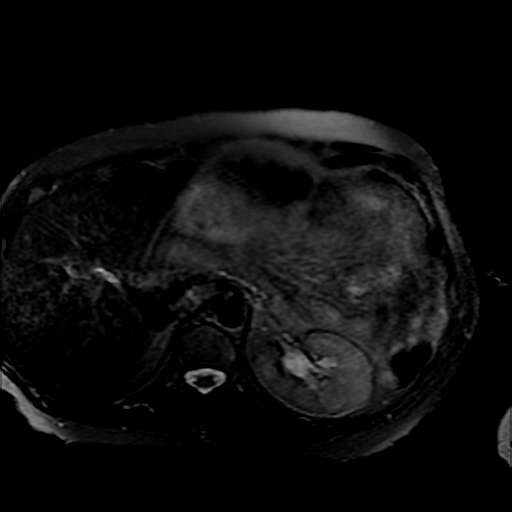

[Series 7: T2 · sagittal · 5.0mm · 0.66mm/px · 2 of 45 slices shown (4 of 4)]
[im 1/45]
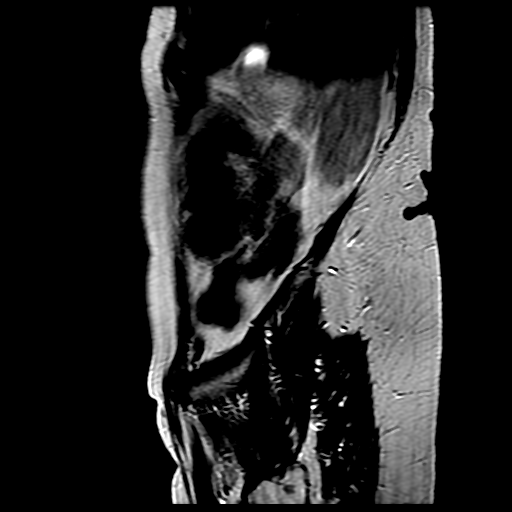
[im 45/45]
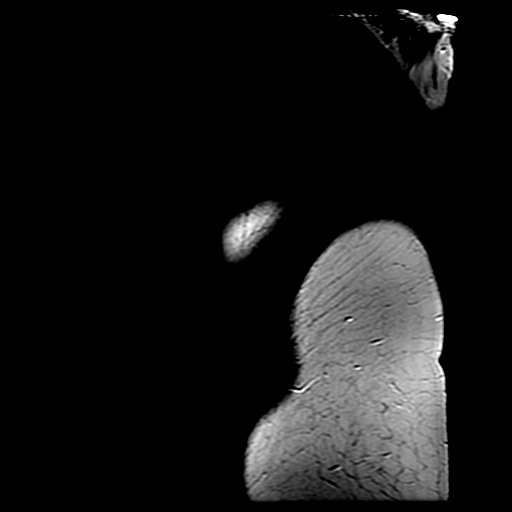

[Series 8: T1 · axial · 5.0mm · 0.62mm/px · z∈[-257,+79]mm · 2 of 57 slices shown]
[im 1/57]
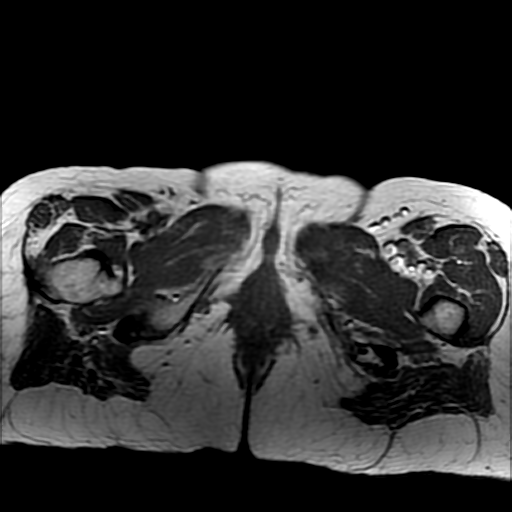
[im 57/57]
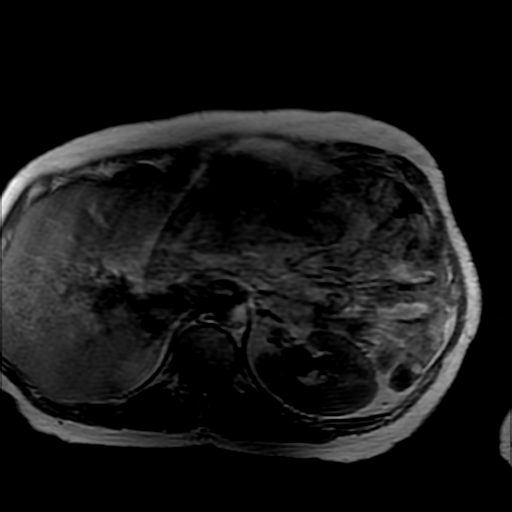

[Series 11: T2 post-contrast · sagittal · 5.0mm · 0.66mm/px · 2 of 45 slices shown]
[im 1/45]
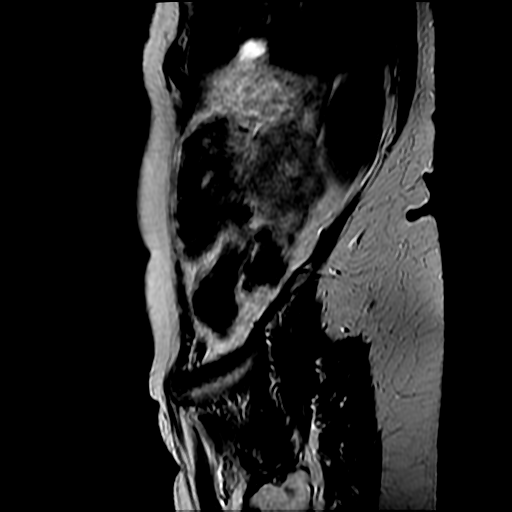
[im 45/45]
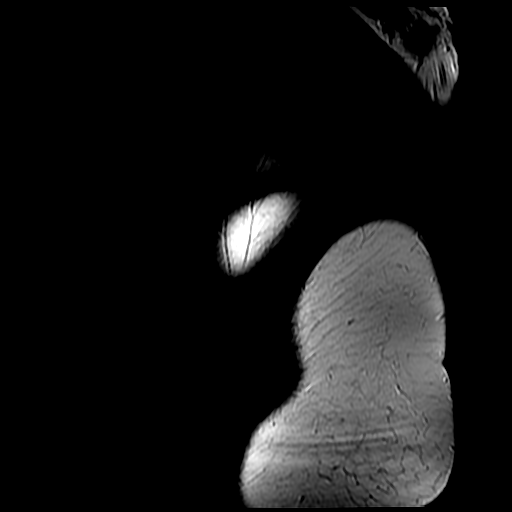

[Series 12: T1 fat-sat post-contrast · axial · 5.0mm · 0.62mm/px · z∈[-257,+79]mm · 2 of 57 slices shown (1 of 2)]
[im 1/57]
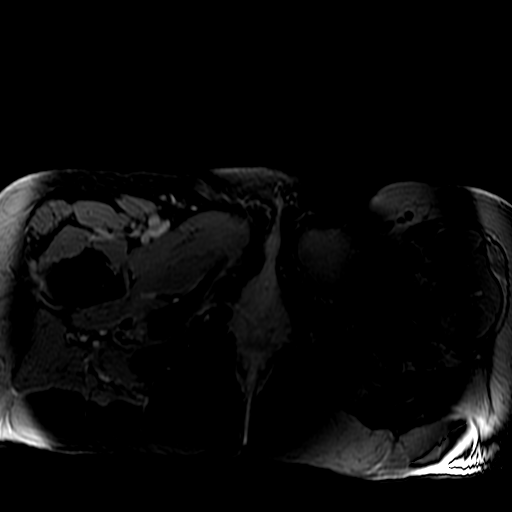
[im 57/57]
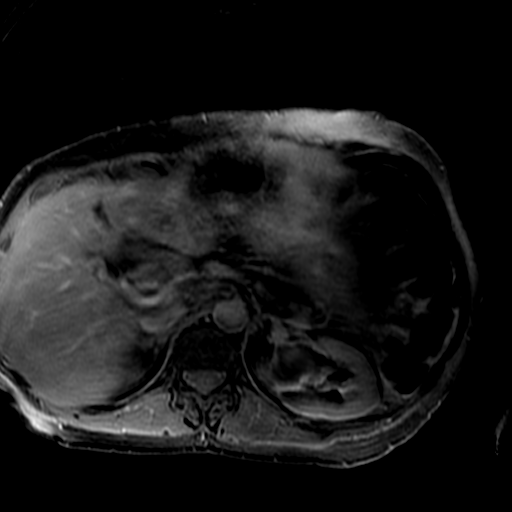

[Series 13: T1 fat-sat post-contrast · coronal · 5.0mm · 0.70mm/px · 1 of 37 slices shown (2 of 2)]
[im 1/37]
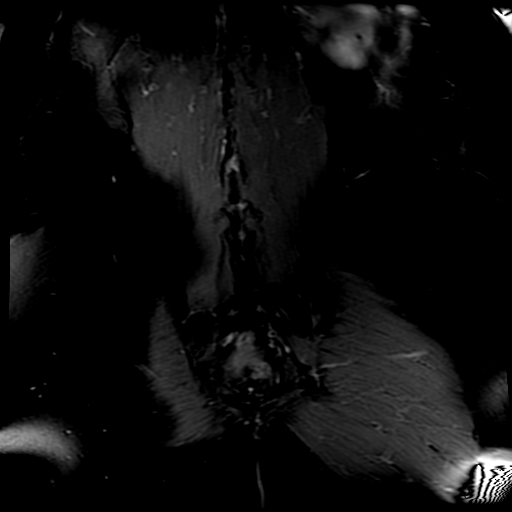

[Series 900: T1 dynamic · axial · 5.0mm · 0.53mm/px · 1 of 140 slices shown]
[im 1/140]
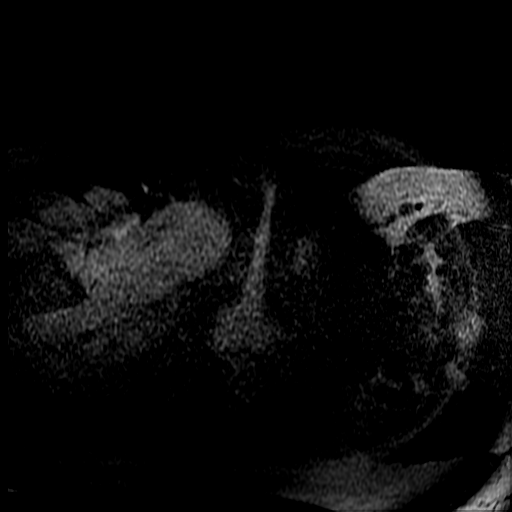

[19 of 48 positions shown; findings below may reference images not displayed]

FINDINGS: Lower Urinary Tract: No bladder or urethral abnormality identified.

Bowel:  Unremarkable visualized pelvic bowel loops.

Vascular/Lymphatic: No pathologically enlarged lymph nodes or other
significant abnormality.

Reproductive:

-- Uterus: Measures 27.0 x 14.9 by 18.7 cm (volume = 6791 cm^3). A
dominant large intramural fibroid is seen occupying nearly the
entire uterine corpus and fundus which measures 22.6 x 14.9 x
cm (volume = 3355 cm^3). The enlarged uterus extends into the mid
abdomen, just to the left of midline. A 1.2 cm intramural fibroid is
also seen in the right lateral lower uterine segment.

-- Intracavitary fibroids:  None.

-- Pedunculated fibroids: None.

-- Fibroid contrast enhancement: The dominant fibroid shows
heterogeneous contrast enhancement, with smaller areas of central
degeneration.

-- Right ovary: Not directly visualized, however no right adnexal
mass identified.

-- Left ovary: 2.2 cm cyst with pleuritic signal intensity noted. No
other adnexal mass identified.

Other: Small amount of free fluid. Moderate left hydronephrosis is
seen due to extrinsic compression of the ureter by the enlarged
uterus.

Musculoskeletal:  Unremarkable.
IMPRESSION: Markedly enlarged uterus extending into the mid abdomen, with
dominant fibroid measuring 22.6 cm. No intracavitary or pedunculated
fibroids identified.

Moderate left hydronephrosis due to extrinsic compression of the
ureter by the enlarged uterus.

2.2 cm hemorrhagic left ovarian cyst, and small amount of free
fluid.

## 2021-09-14 DIAGNOSIS — D485 Neoplasm of uncertain behavior of skin: Secondary | ICD-10-CM | POA: Diagnosis not present

## 2021-09-14 DIAGNOSIS — L988 Other specified disorders of the skin and subcutaneous tissue: Secondary | ICD-10-CM | POA: Diagnosis not present

## 2021-09-15 ENCOUNTER — Encounter: Payer: Self-pay | Admitting: Gynecologic Oncology

## 2021-09-17 NOTE — Patient Instructions (Addendum)
DUE TO COVID-19 ONLY ONE VISITOR IS ALLOWED TO COME WITH YOU AND STAY IN THE WAITING ROOM ONLY DURING PRE OP AND PROCEDURE.   **NO VISITORS ARE ALLOWED IN THE SHORT STAY AREA OR RECOVERY ROOM!!**  IF YOU WILL BE ADMITTED INTO THE HOSPITAL YOU ARE ALLOWED ONLY TWO SUPPORT PEOPLE DURING VISITATION HOURS ONLY (7 AM -8PM)   The support person(s) must pass our screening, gel in and out, and wear a mask at all times, including in the patients room. Patients must also wear a mask when staff or their support person are in the room. Visitors GUEST BADGE MUST BE WORN VISIBLY  One adult visitor may remain with you overnight and MUST be in the room by 8 P.M.  No visitors under the age of 32. Any visitor under the age of 1 must be accompanied by an adult.        Your procedure is scheduled on: 09/29/21   Report to [facility] Main Entrance    Report to Short Stay at: 5:15 AM   Call this number if you have problems the morning of surgery [phone] Eat a light diet the day before surgery.  Examples including soups, broths, toast, yogurt, mashed potatoes.  Things to avoid include carbonated beverages (fizzy beverages), raw fruits and raw vegetables, or beans.   If your bowels are filled with gas, your surgeon will have difficulty visualizing your pelvic organs which increases your surgical risks.    Do not eat food :After Midnight.   May have liquids until: 4:30 AM  day of surgery  CLEAR LIQUID DIET  Foods Allowed                                                                     Foods Excluded  Water, Black Coffee and tea, regular and decaf                             liquids that you cannot  Plain Jell-O in any flavor  (No red)                                           see through such as: Fruit ices (not with fruit pulp)                                     milk, soups, orange juice              Iced Popsicles (No red)                                    All solid food                                    Apple juices Sports drinks like Gatorade (No red) Lightly seasoned clear broth or consume(fat free) Sugar Sample Menu Breakfast  Lunch                                     Supper Cranberry juice                    Beef broth                            Chicken broth Jell-O                                     Grape juice                           Apple juice Coffee or tea                        Jell-O                                      Popsicle                                                Coffee or tea                        Coffee or tea   FOLLOW BOWEL PREP AND ANY ADDITIONAL PRE OP INSTRUCTIONS YOU RECEIVED FROM YOUR SURGEON'S OFFICE!!!   Oral Hygiene is also important to reduce your risk of infection.                                    Remember - BRUSH YOUR TEETH THE MORNING OF SURGERY WITH YOUR REGULAR TOOTHPASTE   Do NOT smoke after Midnight   Take these medicines the morning of surgery with A SIP OF WATER: Rizatriptan as needed.Use inhalers,eye drops and flonase as usual.  DO NOT TAKE ANY ORAL DIABETIC MEDICATIONS DAY OF YOUR SURGERY                              You may not have any metal on your body including hair pins, jewelry, and body piercing             Do not wear make-up, lotions, powders, perfumes/cologne, or deodorant  Do not wear nail polish including gel and S&S, artificial/acrylic nails, or any other type of covering on natural nails including finger and toenails. If you have artificial nails, gel coating, etc. that needs to be removed by a nail salon please have this removed prior to surgery or surgery may need to be canceled/ delayed if the surgeon/ anesthesia feels like they are unable to be safely monitored.   Do not shave  48 hours prior to surgery.    Do not bring valuables to the hospital. [place].   Contacts, dentures  or bridgework may not be worn into  surgery.   Bring small overnight bag day of surgery.    Patients discharged on the day of surgery will not be allowed to drive home.  Someone needs to stay with you for the first 24 hours after anesthesia.   Special Instructions: Bring a copy of your healthcare power of attorney and living will documents         the day of surgery if you haven't scanned them before.              Please read over the following fact sheets you were given: IF YOU HAVE QUESTIONS ABOUT YOUR PRE-OP INSTRUCTIONS PLEASE CALL [phone]     [facility] - Preparing for Surgery Before surgery, you can play an important role.  Because skin is not sterile, your skin needs to be as free of germs as possible.  You can reduce the number of germs on your skin by washing with CHG (chlorahexidine gluconate) soap before surgery.  CHG is an antiseptic cleaner which kills germs and bonds with the skin to continue killing germs even after washing. Please DO NOT use if you have an allergy to CHG or antibacterial soaps.  If your skin becomes reddened/irritated stop using the CHG and inform your nurse when you arrive at Short Stay. Do not shave (including legs and underarms) for at least 48 hours prior to the first CHG shower.  You may shave your face/neck. Please follow these instructions carefully:  1.  Shower with CHG Soap the night before surgery and the  morning of Surgery.  2.  If you choose to wash your hair, wash your hair first as usual with your  normal  shampoo.  3.  After you shampoo, rinse your hair and body thoroughly to remove the  shampoo.                           4.  Use CHG as you would any other liquid soap.  You can apply chg directly  to the skin and wash                       Gently with a scrungie or clean washcloth.  5.  Apply the CHG Soap to your body ONLY FROM THE NECK DOWN.   Do not use on face/ open                           Wound or open sores. Avoid contact with eyes, ears mouth and genitals (private  parts).                       Wash face,  Genitals (private parts) with your normal soap.             6.  Wash thoroughly, paying special attention to the area where your surgery  will be performed.  7.  Thoroughly rinse your body with warm water from the neck down.  8.  DO NOT shower/wash with your normal soap after using and rinsing off  the CHG Soap.                9.  Pat yourself dry with a clean towel.            10.  Wear clean pajamas.  11.  Place clean sheets on your bed the night of your first shower and do not  sleep with pets. Day of Surgery : Do not apply any lotions/deodorants the morning of surgery.  Please wear clean clothes to the hospital/surgery center.  FAILURE TO FOLLOW THESE INSTRUCTIONS MAY RESULT IN THE CANCELLATION OF YOUR SURGERY PATIENT SIGNATURE_________________________________  NURSE SIGNATURE__________________________________  ________________________________________________________________________  WHAT IS A BLOOD TRANSFUSION? Blood Transfusion Information  A transfusion is the replacement of blood or some of its parts. Blood is made up of multiple cells which provide different functions. Red blood cells carry oxygen and are used for blood loss replacement. White blood cells fight against infection. Platelets control bleeding. Plasma helps clot blood. Other blood products are available for specialized needs, such as hemophilia or other clotting disorders. BEFORE THE TRANSFUSION  Who gives blood for transfusions?  Healthy volunteers who are fully evaluated to make sure their blood is safe. This is blood bank blood. Transfusion therapy is the safest it has ever been in the practice of medicine. Before blood is taken from a donor, a complete history is taken to make sure that person has no history of diseases nor engages in risky social behavior (examples are intravenous drug use or sexual activity with multiple partners). The donor's travel history  is screened to minimize risk of transmitting infections, such as malaria. The donated blood is tested for signs of infectious diseases, such as HIV and hepatitis. The blood is then tested to be sure it is compatible with you in order to minimize the chance of a transfusion reaction. If you or a relative donates blood, this is often done in anticipation of surgery and is not appropriate for emergency situations. It takes many days to process the donated blood. RISKS AND COMPLICATIONS Although transfusion therapy is very safe and saves many lives, the main dangers of transfusion include:  Getting an infectious disease. Developing a transfusion reaction. This is an allergic reaction to something in the blood you were given. Every precaution is taken to prevent this. The decision to have a blood transfusion has been considered carefully by your caregiver before blood is given. Blood is not given unless the benefits outweigh the risks. AFTER THE TRANSFUSION Right after receiving a blood transfusion, you will usually feel much better and more energetic. This is especially true if your red blood cells have gotten low (anemic). The transfusion raises the level of the red blood cells which carry oxygen, and this usually causes an energy increase. The nurse administering the transfusion will monitor you carefully for complications. HOME CARE INSTRUCTIONS  No special instructions are needed after a transfusion. You may find your energy is better. Speak with your caregiver about any limitations on activity for underlying diseases you may have. SEEK MEDICAL CARE IF:  Your condition is not improving after your transfusion. You develop redness or irritation at the intravenous (IV) site. SEEK IMMEDIATE MEDICAL CARE IF:  Any of the following symptoms occur over the next 12 hours: Shaking chills. You have a temperature by mouth above 102 F (38.9 C), not controlled by medicine. Chest, back, or muscle pain. People  around you feel you are not acting correctly or are confused. Shortness of breath or difficulty breathing. Dizziness and fainting. You get a rash or develop hives. You have a decrease in urine output. Your urine turns a dark color or changes to pink, red, or brown. Any of the following symptoms occur over the next 10 days: You have a  temperature by mouth above 102 F (38.9 C), not controlled by medicine. Shortness of breath. Weakness after normal activity. The white part of the eye turns yellow (jaundice). You have a decrease in the amount of urine or are urinating less often. Your urine turns a dark color or changes to pink, red, or brown. Document Released: 07/16/2000 Document Revised: 10/11/2011 Document Reviewed: 03/04/2008 [facility] Patient Information 2014 [place], [place].  _______________________________________________________________________

## 2021-09-18 ENCOUNTER — Telehealth: Payer: Self-pay

## 2021-09-18 ENCOUNTER — Ambulatory Visit: Payer: BC Managed Care – PPO | Admitting: Gynecologic Oncology

## 2021-09-18 ENCOUNTER — Encounter (HOSPITAL_COMMUNITY)
Admission: RE | Admit: 2021-09-18 | Discharge: 2021-09-18 | Disposition: A | Discharge status: Home / Self Care | Payer: BC Managed Care – PPO | Source: Ambulatory Visit | Attending: Anesthesiology | Admitting: Anesthesiology

## 2021-09-18 DIAGNOSIS — D250 Submucous leiomyoma of uterus: Secondary | ICD-10-CM

## 2021-09-18 NOTE — Telephone Encounter (Signed)
Called patient to change pre-op with Melissa due to a scheduling conflict.  Scheduled her for Tuesday, February 21st at 1130am. Patient requesting change for pre-admission testing appointment as well.  Informed her someone from the office will call her back with date and time.

## 2021-09-18 NOTE — Telephone Encounter (Signed)
Called and let the patient know her appt for pre admission was to moved to 2/21 at 10 am

## 2021-09-21 ENCOUNTER — Encounter (HOSPITAL_COMMUNITY): Payer: BC Managed Care – PPO

## 2021-09-21 ENCOUNTER — Encounter: Payer: Self-pay | Admitting: Gynecologic Oncology

## 2021-09-21 ENCOUNTER — Telehealth: Payer: Self-pay

## 2021-09-21 NOTE — Patient Instructions (Signed)
DUE TO COVID-19 ONLY ONE VISITOR IS ALLOWED TO COME WITH YOU AND STAY IN THE WAITING ROOM ONLY DURING PRE OP AND PROCEDURE.   **NO VISITORS ARE ALLOWED IN THE SHORT STAY AREA OR RECOVERY ROOM!!**  IF YOU WILL BE ADMITTED INTO THE HOSPITAL YOU ARE ALLOWED ONLY TWO SUPPORT PEOPLE DURING VISITATION HOURS ONLY (7 AM -8PM)   The support person(s) must pass our screening, gel in and out, and wear a mask at all times, including in the patients room. Patients must also wear a mask when staff or their support person are in the room. Visitors GUEST BADGE MUST BE WORN VISIBLY  One adult visitor may remain with you overnight and MUST be in the room by 8 P.M.  No visitors under the age of 60. Any visitor under the age of 73 must be accompanied by an adult.        Your procedure is scheduled on: 09/29/21   Report to [facility] Main Entrance    Report to Short Stay at: 5:15 AM   Call this number if you have problems the morning of surgery [phone]   Eat a light diet the day before surgery.  Examples including soups, broths, toast, yogurt, mashed potatoes.  Things to avoid include carbonated beverages (fizzy beverages), raw fruits and raw vegetables, or beans.   If your bowels are filled with gas, your surgeon will have difficulty visualizing your pelvic organs which increases your surgical risks.   Do not eat food :After Midnight.   May have liquids until : 4:30 AM   day of surgery  CLEAR LIQUID DIET  Foods Allowed                                                                     Foods Excluded  Water, Black Coffee and tea, regular and decaf                             liquids that you cannot  Plain Jell-O in any flavor  (No red)                                           see through such as: Fruit ices (not with fruit pulp)                                     milk, soups, orange juice              Iced Popsicles (No red)                                    All solid food                                    Apple juices Sports drinks like Gatorade (No red) Lightly seasoned clear broth or consume(fat free) Sugar Sample  Menu Breakfast                                Lunch                                     Supper Cranberry juice                    Beef broth                            Chicken broth Jell-O                                     Grape juice                           Apple juice Coffee or tea                        Jell-O                                      Popsicle                                                Coffee or tea                        Coffee or tea    FOLLOW BOWEL PREP AND ANY ADDITIONAL PRE OP INSTRUCTIONS YOU RECEIVED FROM YOUR SURGEON'S OFFICE!!!   Oral Hygiene is also important to reduce your risk of infection.                                    Remember - BRUSH YOUR TEETH THE MORNING OF SURGERY WITH YOUR REGULAR TOOTHPASTE   Do NOT smoke after Midnight   Take these medicines the morning of surgery with A SIP OF WATER: N/A. Use inhalers and Flonase as usual.  DO NOT TAKE ANY ORAL DIABETIC MEDICATIONS DAY OF YOUR SURGERY                              You may not have any metal on your body including hair pins, jewelry, and body piercing             Do not wear make-up, lotions, powders, perfumes/cologne, or deodorant  Do not wear nail polish including gel and S&S, artificial/acrylic nails, or any other type of covering on natural nails including finger and toenails. If you have artificial nails, gel coating, etc. that needs to be removed by a nail salon please have this removed prior to surgery or surgery may need to be canceled/ delayed if the surgeon/ anesthesia feels like they are unable to be safely monitored.   Do not shave  48 hours prior to surgery.  Do not bring valuables to the hospital. [place].   Contacts, dentures or bridgework may not be worn into surgery.   Bring small  overnight bag day of surgery.    Patients discharged on the day of surgery will not be allowed to drive home.  Someone needs to stay with you for the first 24 hours after anesthesia.   Special Instructions: Bring a copy of your healthcare power of attorney and living will documents         the day of surgery if you haven't scanned them before.              Please read over the following fact sheets you were given: IF YOU HAVE QUESTIONS ABOUT YOUR PRE-OP INSTRUCTIONS PLEASE CALL [phone]     [facility] - Preparing for Surgery Before surgery, you can play an important role.  Because skin is not sterile, your skin needs to be as free of germs as possible.  You can reduce the number of germs on your skin by washing with CHG (chlorahexidine gluconate) soap before surgery.  CHG is an antiseptic cleaner which kills germs and bonds with the skin to continue killing germs even after washing. Please DO NOT use if you have an allergy to CHG or antibacterial soaps.  If your skin becomes reddened/irritated stop using the CHG and inform your nurse when you arrive at Short Stay. Do not shave (including legs and underarms) for at least 48 hours prior to the first CHG shower.  You may shave your face/neck. Please follow these instructions carefully:  1.  Shower with CHG Soap the night before surgery and the  morning of Surgery.  2.  If you choose to wash your hair, wash your hair first as usual with your  normal  shampoo.  3.  After you shampoo, rinse your hair and body thoroughly to remove the  shampoo.                           4.  Use CHG as you would any other liquid soap.  You can apply chg directly  to the skin and wash                       Gently with a scrungie or clean washcloth.  5.  Apply the CHG Soap to your body ONLY FROM THE NECK DOWN.   Do not use on face/ open                           Wound or open sores. Avoid contact with eyes, ears mouth and genitals (private parts).                        Wash face,  Genitals (private parts) with your normal soap.             6.  Wash thoroughly, paying special attention to the area where your surgery  will be performed.  7.  Thoroughly rinse your body with warm water from the neck down.  8.  DO NOT shower/wash with your normal soap after using and rinsing off  the CHG Soap.                9.  Pat yourself  dry with a clean towel.            10.  Wear clean pajamas.            11.  Place clean sheets on your bed the night of your first shower and do not  sleep with pets. Day of Surgery : Do not apply any lotions/deodorants the morning of surgery.  Please wear clean clothes to the hospital/surgery center.  FAILURE TO FOLLOW THESE INSTRUCTIONS MAY RESULT IN THE CANCELLATION OF YOUR SURGERY PATIENT SIGNATURE_________________________________  NURSE SIGNATURE__________________________________  ________________________________________________________________________  WHAT IS A BLOOD TRANSFUSION? Blood Transfusion Information  A transfusion is the replacement of blood or some of its parts. Blood is made up of multiple cells which provide different functions. Red blood cells carry oxygen and are used for blood loss replacement. White blood cells fight against infection. Platelets control bleeding. Plasma helps clot blood. Other blood products are available for specialized needs, such as hemophilia or other clotting disorders. BEFORE THE TRANSFUSION  Who gives blood for transfusions?  Healthy volunteers who are fully evaluated to make sure their blood is safe. This is blood bank blood. Transfusion therapy is the safest it has ever been in the practice of medicine. Before blood is taken from a donor, a complete history is taken to make sure that person has no history of diseases nor engages in risky social behavior (examples are intravenous drug use or sexual activity with multiple partners). The donor's travel history is screened to minimize risk of  transmitting infections, such as malaria. The donated blood is tested for signs of infectious diseases, such as HIV and hepatitis. The blood is then tested to be sure it is compatible with you in order to minimize the chance of a transfusion reaction. If you or a relative donates blood, this is often done in anticipation of surgery and is not appropriate for emergency situations. It takes many days to process the donated blood. RISKS AND COMPLICATIONS Although transfusion therapy is very safe and saves many lives, the main dangers of transfusion include:  Getting an infectious disease. Developing a transfusion reaction. This is an allergic reaction to something in the blood you were given. Every precaution is taken to prevent this. The decision to have a blood transfusion has been considered carefully by your caregiver before blood is given. Blood is not given unless the benefits outweigh the risks. AFTER THE TRANSFUSION Right after receiving a blood transfusion, you will usually feel much better and more energetic. This is especially true if your red blood cells have gotten low (anemic). The transfusion raises the level of the red blood cells which carry oxygen, and this usually causes an energy increase. The nurse administering the transfusion will monitor you carefully for complications. HOME CARE INSTRUCTIONS  No special instructions are needed after a transfusion. You may find your energy is better. Speak with your caregiver about any limitations on activity for underlying diseases you may have. SEEK MEDICAL CARE IF:  Your condition is not improving after your transfusion. You develop redness or irritation at the intravenous (IV) site. SEEK IMMEDIATE MEDICAL CARE IF:  Any of the following symptoms occur over the next 12 hours: Shaking chills. You have a temperature by mouth above 102 F (38.9 C), not controlled by medicine. Chest, back, or muscle pain. People around you feel you are not  acting correctly or are confused. Shortness of breath or difficulty breathing. Dizziness and fainting. You get a rash or develop hives. You have  a decrease in urine output. Your urine turns a dark color or changes to pink, red, or brown. Any of the following symptoms occur over the next 10 days: You have a temperature by mouth above 102 F (38.9 C), not controlled by medicine. Shortness of breath. Weakness after normal activity. The white part of the eye turns yellow (jaundice). You have a decrease in the amount of urine or are urinating less often. Your urine turns a dark color or changes to pink, red, or brown. Document Released: 07/16/2000 Document Revised: 10/11/2011 Document Reviewed: 03/04/2008 [facility] Patient Information 2014 [place], [place].  _______________________________________________________________________

## 2021-09-21 NOTE — Telephone Encounter (Signed)
Called patient to review meaningful use prior to her Pre-op appointment with Joylene John, NP tomorrow. Patient stated that she talked with her family over the weekend and has decided not to go through with surgery. She stated that she wants to focus on her health and needs more time to think about it because she is not ready. Will make Joylene John, NP aware. Informed patient to call back with any questions or concerns.

## 2021-09-22 ENCOUNTER — Telehealth: Payer: Self-pay | Admitting: Gynecologic Oncology

## 2021-09-22 ENCOUNTER — Encounter (HOSPITAL_COMMUNITY)
Admission: RE | Admit: 2021-09-22 | Discharge: 2021-09-22 | Disposition: A | Discharge status: Home / Self Care | Payer: BC Managed Care – PPO | Source: Ambulatory Visit | Attending: Family Medicine | Admitting: Family Medicine

## 2021-09-22 ENCOUNTER — Ambulatory Visit: Payer: BC Managed Care – PPO | Admitting: Gynecologic Oncology

## 2021-09-22 NOTE — Telephone Encounter (Signed)
Following up with Ms. Monia Price regarding her decision to cancel her surgery. Advised patient that Dr. Berline Lopes had tried to reach out to her yesterday to follow up but was unable to contact her. Called transferred to Joylene John, NP for further follow up.

## 2021-09-22 NOTE — Telephone Encounter (Signed)
Called to check in with patient to follow-up on a message left with the office yesterday stating she wanted to cancel her [place] and surgery for beginning of next week.  The patient states she really does want to cancel her procedures for Monday and Tuesday of next week.  She states she really does not want to have surgery and considers that a last resort.  She states she would like to make changes to her health including working on her blood pressure and losing weight with hopes that this would help take care of the problem with the fibroid.  She states some medical providers may feel like this would not be adequate but she would like to try this first.  She feels like a lot of her symptoms are related to her IBS and she would like to cut down on the inflammation in her body and continue to work on her diet.  She states she does not want to do the embolization and she never wanted to do it.  She did some research on the Internet and found that there can be unbearable pain as a side effect and she states she would never want to do this.  She states she wants to do these changes first and then could always come back to these things.  She is advised to call the office for any needs.

## 2021-09-28 ENCOUNTER — Other Ambulatory Visit (HOSPITAL_COMMUNITY): Payer: BC Managed Care – PPO

## 2021-09-28 ENCOUNTER — Ambulatory Visit (HOSPITAL_COMMUNITY): Payer: Medicare Other

## 2021-09-28 ENCOUNTER — Encounter (HOSPITAL_COMMUNITY): Payer: Self-pay

## 2021-09-29 ENCOUNTER — Encounter (HOSPITAL_COMMUNITY): Admission: RE | Payer: Self-pay | Source: Home / Self Care

## 2021-09-29 ENCOUNTER — Ambulatory Visit (HOSPITAL_COMMUNITY): Admission: RE | Admit: 2021-09-29 | Payer: Medicare Other | Source: Home / Self Care | Admitting: Gynecologic Oncology

## 2021-09-29 DIAGNOSIS — D251 Intramural leiomyoma of uterus: Secondary | ICD-10-CM

## 2021-09-29 SURGERY — XI ROBOTIC ASSISTED LAPAROSCOPIC HYSTERECTOMY AND SALPINGECTOMY
Anesthesia: General

## 2021-10-01 NOTE — Progress Notes (Signed)
This encounter was created in error - please disregard.

## 2021-10-01 NOTE — Progress Notes (Signed)
Pt cancelled this appt. This encounter was created in error - please disregard.

## 2021-10-19 ENCOUNTER — Encounter: Payer: BC Managed Care – PPO | Admitting: Gynecologic Oncology

## 2021-11-09 DIAGNOSIS — S52501A Unspecified fracture of the lower end of right radius, initial encounter for closed fracture: Secondary | ICD-10-CM | POA: Diagnosis not present

## 2021-11-09 DIAGNOSIS — S6991XA Unspecified injury of right wrist, hand and finger(s), initial encounter: Secondary | ICD-10-CM | POA: Diagnosis not present

## 2021-11-12 DIAGNOSIS — S52501A Unspecified fracture of the lower end of right radius, initial encounter for closed fracture: Secondary | ICD-10-CM | POA: Diagnosis not present

## 2021-11-12 DIAGNOSIS — S6991XA Unspecified injury of right wrist, hand and finger(s), initial encounter: Secondary | ICD-10-CM | POA: Diagnosis not present

## 2021-11-26 DIAGNOSIS — S52501A Unspecified fracture of the lower end of right radius, initial encounter for closed fracture: Secondary | ICD-10-CM | POA: Diagnosis not present

## 2021-11-26 DIAGNOSIS — S6991XA Unspecified injury of right wrist, hand and finger(s), initial encounter: Secondary | ICD-10-CM | POA: Diagnosis not present

## 2021-12-17 DIAGNOSIS — S6991XA Unspecified injury of right wrist, hand and finger(s), initial encounter: Secondary | ICD-10-CM | POA: Diagnosis not present

## 2021-12-17 DIAGNOSIS — S52501A Unspecified fracture of the lower end of right radius, initial encounter for closed fracture: Secondary | ICD-10-CM | POA: Diagnosis not present

## 2022-01-14 DIAGNOSIS — S6991XA Unspecified injury of right wrist, hand and finger(s), initial encounter: Secondary | ICD-10-CM | POA: Diagnosis not present

## 2022-01-14 DIAGNOSIS — S52501A Unspecified fracture of the lower end of right radius, initial encounter for closed fracture: Secondary | ICD-10-CM | POA: Diagnosis not present

## 2022-06-10 ENCOUNTER — Telehealth: Payer: Self-pay

## 2022-08-30 DIAGNOSIS — I700 Atherosclerosis of aorta: Secondary | ICD-10-CM | POA: Diagnosis not present

## 2022-08-30 DIAGNOSIS — Z23 Encounter for immunization: Secondary | ICD-10-CM | POA: Diagnosis not present

## 2022-08-30 DIAGNOSIS — E785 Hyperlipidemia, unspecified: Secondary | ICD-10-CM | POA: Diagnosis not present

## 2022-08-30 DIAGNOSIS — G43909 Migraine, unspecified, not intractable, without status migrainosus: Secondary | ICD-10-CM | POA: Diagnosis not present

## 2022-08-30 DIAGNOSIS — D259 Leiomyoma of uterus, unspecified: Secondary | ICD-10-CM | POA: Diagnosis not present

## 2022-08-30 DIAGNOSIS — Z0000 Encounter for general adult medical examination without abnormal findings: Secondary | ICD-10-CM | POA: Diagnosis not present

## 2022-09-10 DIAGNOSIS — L720 Epidermal cyst: Secondary | ICD-10-CM | POA: Diagnosis not present

## 2022-09-10 DIAGNOSIS — D2261 Melanocytic nevi of right upper limb, including shoulder: Secondary | ICD-10-CM | POA: Diagnosis not present

## 2022-09-10 DIAGNOSIS — D2239 Melanocytic nevi of other parts of face: Secondary | ICD-10-CM | POA: Diagnosis not present

## 2022-09-10 DIAGNOSIS — Z85828 Personal history of other malignant neoplasm of skin: Secondary | ICD-10-CM | POA: Diagnosis not present

## 2022-11-26 ENCOUNTER — Telehealth: Payer: Self-pay | Admitting: Internal Medicine

## 2022-11-26 NOTE — Telephone Encounter (Signed)
Pt c/o of Chest Pain: STAT if CP now or developed within 24 hours  1. Are you having CP right now? No  2. Are you experiencing any other symptoms (ex. SOB, nausea, vomiting, sweating)? No  3. How long have you been experiencing CP? A few months now  4. Is your CP continuous or coming and going? Coming and going  5. Have you taken Nitroglycerin? No  Patient stated that it is similar to to the pain she had when she originally saw Dr. Okey Price. Patient stated that this pain has been more intense. Please advise. ?

## 2022-11-26 NOTE — Telephone Encounter (Signed)
Spoke to the patient, she is experiencing chest pain, hear fluttering off and on for a while. Pt stated the chest pain is becoming more frequent. She denies any other symptoms. Pt is scheduled with APP on 5/2, explained ED precautions, and patient voiced understanding.

## 2022-12-02 ENCOUNTER — Other Ambulatory Visit
Admission: RE | Admit: 2022-12-02 | Discharge: 2022-12-02 | Disposition: A | Discharge status: Home / Self Care | Payer: BC Managed Care – PPO | Source: Ambulatory Visit | Attending: Medical | Admitting: Medical

## 2022-12-02 ENCOUNTER — Encounter: Payer: Self-pay | Admitting: Medical

## 2022-12-02 ENCOUNTER — Ambulatory Visit: Payer: BC Managed Care – PPO | Attending: Medical | Admitting: Medical

## 2022-12-02 VITALS — BP 124/76 | HR 70 | Ht 63.0 in | Wt 145.4 lb

## 2022-12-02 DIAGNOSIS — I4892 Unspecified atrial flutter: Secondary | ICD-10-CM | POA: Diagnosis not present

## 2022-12-02 DIAGNOSIS — R002 Palpitations: Secondary | ICD-10-CM | POA: Insufficient documentation

## 2022-12-02 DIAGNOSIS — I491 Atrial premature depolarization: Secondary | ICD-10-CM | POA: Diagnosis not present

## 2022-12-02 DIAGNOSIS — R079 Chest pain, unspecified: Secondary | ICD-10-CM | POA: Insufficient documentation

## 2022-12-02 DIAGNOSIS — D250 Submucous leiomyoma of uterus: Secondary | ICD-10-CM

## 2022-12-02 DIAGNOSIS — D251 Intramural leiomyoma of uterus: Secondary | ICD-10-CM | POA: Insufficient documentation

## 2022-12-02 DIAGNOSIS — D252 Subserosal leiomyoma of uterus: Secondary | ICD-10-CM | POA: Diagnosis not present

## 2022-12-02 LAB — COMPREHENSIVE METABOLIC PANEL
ALT: 17 U/L (ref 0–44)
AST: 23 U/L (ref 15–41)
Albumin: 4.4 g/dL (ref 3.5–5.0)
Alkaline Phosphatase: 66 U/L (ref 38–126)
Anion gap: 7 (ref 5–15)
BUN: 13 mg/dL (ref 8–23)
CO2: 27 mmol/L (ref 22–32)
Calcium: 8.8 mg/dL — ABNORMAL LOW (ref 8.9–10.3)
Chloride: 101 mmol/L (ref 98–111)
Creatinine, Ser: 0.83 mg/dL (ref 0.44–1.00)
GFR, Estimated: >60 mL/min (ref >60)
Glucose, Bld: 99 mg/dL (ref 70–99)
Potassium: 4.1 mmol/L (ref 3.5–5.1)
Sodium: 135 mmol/L (ref 135–145)
Total Bilirubin: 0.5 mg/dL (ref 0.3–1.2)
Total Protein: 6.5 g/dL (ref 6.5–8.1)

## 2022-12-02 LAB — CBC
HCT: 35.6 % — ABNORMAL LOW (ref 36.0–46.0)
Hemoglobin: 11.6 g/dL — ABNORMAL LOW (ref 12.0–15.0)
MCH: 28.1 pg (ref 26.0–34.0)
MCHC: 32.6 g/dL (ref 30.0–36.0)
MCV: 86.2 fL (ref 80.0–100.0)
Platelets: 298 10*3/uL (ref 150–400)
RBC: 4.13 MIL/uL (ref 3.87–5.11)
RDW: 14.6 % (ref 11.5–15.5)
WBC: 6.3 10*3/uL (ref 4.0–10.5)
nRBC: 0 % (ref 0.0–0.2)

## 2022-12-02 LAB — TSH: TSH: 2.672 u[IU]/mL (ref 0.350–4.500)

## 2022-12-02 LAB — MAGNESIUM: Magnesium: 2.2 mg/dL (ref 1.7–2.4)

## 2022-12-02 NOTE — Patient Instructions (Signed)
Medication Instructions:  Your physician recommends that you continue on your current medications as directed. Please refer to the Current Medication list given to you today.  *If you need a refill on your cardiac medications before your next appointment, please call your pharmacy*   Lab Work: Your physician recommends that you get lab work today:   Sales executive at [facility] 1st desk on the right to check in (REGISTRATION)  Lab hours: Monday- Friday (7:30 am- 5:30 pm)  If you have labs (blood work) drawn today and your tests are completely normal, you will receive your results only by: MyChart Message (if you have MyChart) OR A paper copy in the mail If you have any lab test that is abnormal or we need to change your treatment, we will call you to review the results.   Testing/Procedures: -None ordered   Follow-Up: At [facility], you and your health needs are our priority.  As part of our continuing mission to provide you with exceptional heart care, we have created designated Provider Care Teams.  These Care Teams include your primary Cardiologist (physician) and Advanced Practice Providers (APPs -  Physician Assistants and Nurse Practitioners) who all work together to provide you with the care you need, when you need it.  We recommend signing up for the patient portal called "MyChart".  Sign up information is provided on this After Visit Summary.  MyChart is used to connect with patients for Virtual Visits (Telemedicine).  Patients are able to view lab/test results, encounter notes, upcoming appointments, etc.  Non-urgent messages can be sent to your provider as well.   To learn more about what you can do with MyChart, go to [email or web address].    Your next appointment:   6 month(s)  Provider:   You may see Yvonne Kendall, MD  or one of the following Advanced Practice Providers on your designated Care Team:   Nicolasa Ducking, NP Eula Listen, PA-C Cadence  Fransico Michael, PA-C Charlsie Quest, NP    Other Instructions -None

## 2022-12-02 NOTE — Progress Notes (Signed)
Cardiology Office Note:    Date:  12/02/2022   ID:  Jane Price, DOB [birth year], MRN [id number]  PCP:  Merri Brunette, MD  [facility] Cardiologist:  None  [facility] Electrophysiologist:  None   Referring MD: Merri Brunette, MD   Chief Complaint: 2 year follow-up  History of Present Illness:    Jane Price is a 67 y.o. female with a hx of hypertension, PTSD, and uterine fibroids who presents for 2 year follow-up.  Was seen in 2020 for incidentally noted coronary artery calcification as well as back and shoulder pain.  Noninvasive ischemic testing was recommended.  Exercise tolerance test was low risk without evidence of inducible ischemia.  The patient was seen May 2021 for preoperative cardiovascular assessment prior to hysterectomy.  Was felt patient was low risk for surgery, no further cardiac workup was recommended.  Today, the patient reports chest pain. She would have pain in the back left around her shoulder. Some pains would radiate under arm or into the front. This was a few weeks ago. She was also having occasional flutter. The only thing different was she started walking. She is walking 30 minutes daily. No shortness of breath or lower leg edema. No lightheadedness or dizziness.   Past Medical History:  Diagnosis Date   Abdominal mass 04/2019   CT scan    Allergic rhinitis    Atherosclerosis of aorta (HCC)    Atherosclerosis of coronary artery    Fibroids    Fibroids    Hypertension    Lung nodules 04/2019   Migraines    PTSD (post-traumatic stress disorder)    Sinus tachycardia    Uterine fibroid     Past Surgical History:  Procedure Laterality Date   ADENOIDECTOMY     CESAREAN SECTION     IR RADIOLOGIST EVAL & MGMT  08/14/2021   TONSILLECTOMY      Current Medications: Current Meds  Medication Sig   albuterol (PROVENTIL HFA;VENTOLIN HFA) 108 (90 Base) MCG/ACT inhaler Inhale 1 puff into the lungs every 6 (six) hours as needed for wheezing  or shortness of breath.   aspirin-acetaminophen-caffeine (EXCEDRIN MIGRAINE) 250-250-65 MG tablet Take by mouth every 6 (six) hours as needed for headache.   fexofenadine (ALLEGRA) 180 MG tablet Take 180 mg by mouth at bedtime.   fluticasone (FLONASE) 50 MCG/ACT nasal spray Place 1 spray into both nostrils daily.   glucosamine-chondroitin 500-400 MG tablet Take 1 tablet by mouth daily.   ibuprofen (ADVIL) 200 MG tablet Take 400 mg by mouth every 6 (six) hours as needed (Muscle pain).   Multiple Vitamin (MULTIVITAMIN) tablet Take 1 tablet by mouth daily.   Olopatadine HCl 0.7 % SOLN Place 1 drop into both eyes daily.   rizatriptan (MAXALT-MLT) 10 MG disintegrating tablet Take 5-10 mg by mouth as needed for migraine.   sodium fluoride (PREVIDENT 5000 PLUS) 1.1 % CREA dental cream Place 1 application onto teeth in the morning and at bedtime.     Allergies:   Egg-derived products, Erythromycin base, Other, Rice, Ciprofloxacin, Dairycare [lactase-lactobacillus], Doxycycline, Gluten meal, Metronidazole, Nasacort [triamcinolone], Soy allergy, Tobramycin, Penicillins, and Sulfamethoxazole   Social History   Socioeconomic History   Marital status: Married    Spouse name: Not on file   Number of children: Not on file   Years of education: Not on file   Highest education level: Not on file  Occupational History   Not on file  Tobacco Use   Smoking status: Never   Smokeless  tobacco: Never  Vaping Use   Vaping Use: Never used  Substance and Sexual Activity   Alcohol use: Never   Drug use: Never   Sexual activity: Yes  Other Topics Concern   Not on file  Social History Narrative   Not on file   Social Determinants of Health   Financial Resource Strain: Not on file  Food Insecurity: Not on file  Transportation Needs: Not on file  Physical Activity: Not on file  Stress: Not on file  Social Connections: Not on file     Family History: The patient's family history includes COPD in her  mother; Cancer in her sister.  ROS:   Please see the history of present illness.     All other systems reviewed and are negative.  EKGs/Labs/Other Studies Reviewed:    The following studies were reviewed today:  ETT 12/2019 Blood pressure demonstrated a normal response to exercise. No T wave inversion was noted during stress. There was no ST segment deviation noted during stress. Overall, the patient's exercise capacity was normal. Duke Treadmill Score: low risk   Negative stress test without evidence of ischemia at given workload.    EKG:  EKG is ordered today.  The ekg ordered today demonstrates SB, 58bpm, nonspecific T wave changes  Recent Labs: 12/02/2022: Hemoglobin 11.6; Platelets 298  Recent Lipid Panel No results found for: "CHOL", "TRIG", "HDL", "CHOLHDL", "VLDL", "LDLCALC", "LDLDIRECT"  Physical Exam:    VS:  BP 124/76 (BP Location: Left Arm, Patient Position: Sitting, Cuff Size: Normal)   Pulse 70   Ht 5\' 3"  (1.6 m)   Wt 145 lb 6.4 oz (66 kg)   SpO2 96%   BMI 25.76 kg/m     Wt Readings from Last 3 Encounters:  12/02/22 145 lb 6.4 oz (66 kg)  08/07/21 148 lb 9.6 oz (67.4 kg)  05/11/21 145 lb 9.6 oz (66 kg)     GEN:  Well nourished, well developed in no acute distress HEENT: Normal NECK: No JVD; No carotid bruits LYMPHATICS: No lymphadenopathy CARDIAC: RRR, no murmurs, rubs, gallops RESPIRATORY:  Clear to auscultation without rales, wheezing or rhonchi  ABDOMEN: Soft, non-tender, non-distended MUSCULOSKELETAL:  No edema; No deformity  SKIN: Warm and dry NEUROLOGIC:  Alert and oriented x 3 PSYCHIATRIC:  Normal affect   ASSESSMENT:    1. Palpitations   2. PAC (premature atrial contraction)   3. Chest pain of uncertain etiology    PLAN:    In order of problems listed above:  Palpitations PACs EKG shows NSR with frequent PACs. I suspect this may be causing the flutter. Says symptoms are much improved. AT this point we will start with blood work,  TSH, CMET, CBC, and Mag. We will wait on a heart monitor and continue to monitor symptoms.   Chest pain She reports atypical chest pain that has improved since she started walking. ETT in 2021 was normal. We will plan on watchful waiting.   Disposition: Follow up in 6 month(s) with MD/APP    Signed, Franceska Strahm David Stall, PA-C  12/02/2022 4:17 PM    [facility]

## 2023-05-31 ENCOUNTER — Encounter: Payer: Self-pay | Admitting: Medical

## 2023-05-31 ENCOUNTER — Ambulatory Visit: Payer: BC Managed Care – PPO | Attending: Medical | Admitting: Medical

## 2023-05-31 VITALS — BP 122/68 | HR 79 | Ht 63.0 in | Wt 147.0 lb

## 2023-05-31 DIAGNOSIS — R002 Palpitations: Secondary | ICD-10-CM | POA: Diagnosis not present

## 2023-05-31 DIAGNOSIS — R079 Chest pain, unspecified: Secondary | ICD-10-CM | POA: Diagnosis not present

## 2023-05-31 NOTE — Patient Instructions (Signed)
Medication Instructions:  No changes at this time.    *If you need a refill on your cardiac medications before your next appointment, please call your pharmacy*   Lab Work: None  If you have labs (blood work) drawn today and your tests are completely normal, you will receive your results only by: MyChart Message (if you have MyChart) OR A paper copy in the mail If you have any lab test that is abnormal or we need to change your treatment, we will call you to review the results.   Testing/Procedures: None   Follow-Up: At [facility], you and your health needs are our priority.  As part of our continuing mission to provide you with exceptional heart care, we have created designated Provider Care Teams.  These Care Teams include your primary Cardiologist (physician) and Advanced Practice Providers (APPs -  Physician Assistants and Nurse Practitioners) who all work together to provide you with the care you need, when you need it.   Your next appointment:   1 year(s)  Provider:   You may see one of the following Advanced Practice Providers on your designated Care Team:   Nicolasa Ducking, NP Eula Listen, PA-C [place] Fransico Michael, PA-C Charlsie Quest, NP

## 2023-05-31 NOTE — Progress Notes (Signed)
Cardiology Office Note:    Date:  05/31/2023   ID:  Jane Price, DOB 07-16-1956, MRN [id number]  PCP:  Merri Brunette, MD  [facility] Cardiologist:  None  [facility] Electrophysiologist:  None   Referring MD: Merri Brunette, MD   Chief Complaint: 36-month follow-up  History of Present Illness:    Jane Price is a 67 y.o. female with a hx of hypertension, PTSD, and uterine fibroids who presents for follow-up.   Was seen in 2020 for incidentally noted coronary artery calcification as well as back and shoulder pain.  Noninvasive ischemic testing was recommended.  Exercise tolerance test was low risk without evidence of inducible ischemia.   The patient was last seen in May 2020 for reporting chest pain and palpitations.  Labs were drawn with plan to monitor symptoms.  Today, the patient is overall doing better.  She is having less palpitations and decreased episodes of chest pain. EKG shows NSR with no PACs. She stopped drinking caffeine, which seems to help. She has occasional lightheadedness. She denies exertional chest pain. Breathing is normal. She denies LLE, orthopnea or pnd.   Past Medical History:  Diagnosis Date   Abdominal mass 04/2019   CT scan    Allergic rhinitis    Atherosclerosis of aorta (HCC)    Atherosclerosis of coronary artery    Fibroids    Fibroids    Hypertension    Lung nodules 04/2019   Migraines    PTSD (post-traumatic stress disorder)    Sinus tachycardia    Uterine fibroid     Past Surgical History:  Procedure Laterality Date   ADENOIDECTOMY     CESAREAN SECTION     IR RADIOLOGIST EVAL & MGMT  08/14/2021   TONSILLECTOMY      Current Medications: Current Meds  Medication Sig   albuterol (PROVENTIL HFA;VENTOLIN HFA) 108 (90 Base) MCG/ACT inhaler Inhale 1 puff into the lungs every 6 (six) hours as needed for wheezing or shortness of breath.   aspirin-acetaminophen-caffeine (EXCEDRIN MIGRAINE) 250-250-65 MG tablet Take by  mouth every 6 (six) hours as needed for headache.   fexofenadine (ALLEGRA) 180 MG tablet Take 180 mg by mouth at bedtime.   fluticasone (FLONASE) 50 MCG/ACT nasal spray Place 1 spray into both nostrils daily.   glucosamine-chondroitin 500-400 MG tablet Take 1 tablet by mouth daily.   Multiple Vitamin (MULTIVITAMIN) tablet Take 1 tablet by mouth daily.   Olopatadine HCl 0.7 % SOLN Place 1 drop into both eyes daily.   rizatriptan (MAXALT-MLT) 10 MG disintegrating tablet Take 5-10 mg by mouth as needed for migraine.   sodium fluoride (PREVIDENT 5000 PLUS) 1.1 % CREA dental cream Place 1 application onto teeth in the morning and at bedtime.     Allergies:   Egg-derived products, Erythromycin base, Other, Rice, Ciprofloxacin, Dairycare [lactase-lactobacillus], Doxycycline, Gluten meal, Metronidazole, Nasacort [triamcinolone], Soy allergy, Tobramycin, Penicillins, and Sulfamethoxazole   Social History   Socioeconomic History   Marital status: Married    Spouse name: Not on file   Number of children: Not on file   Years of education: Not on file   Highest education level: Not on file  Occupational History   Not on file  Tobacco Use   Smoking status: Never   Smokeless tobacco: Never  Vaping Use   Vaping status: Never Used  Substance and Sexual Activity   Alcohol use: Never   Drug use: Never   Sexual activity: Yes  Other Topics Concern   Not on file  Social History Narrative   Not on file   Social Determinants of Health   Financial Resource Strain: Not on file  Food Insecurity: Not on file  Transportation Needs: Not on file  Physical Activity: Not on file  Stress: Not on file  Social Connections: Not on file     Family History: The patient's family history includes COPD in her mother; Cancer in her sister.  ROS:   Please see the history of present illness.     All other systems reviewed and are negative.  EKGs/Labs/Other Studies Reviewed:    The following studies were  reviewed today:  ETT 2021 Blood pressure demonstrated a normal response to exercise. No T wave inversion was noted during stress. There was no ST segment deviation noted during stress. Overall, the patient's exercise capacity was normal. Duke Treadmill Score: low risk   Negative stress test without evidence of ischemia at given workload.  EKG:  EKG is ordered today.  The ekg ordered today demonstrates NSR nonspecific ST/T wave changes  Recent Labs: 12/02/2022: ALT 17; BUN 13; Creatinine, Ser 0.83; Hemoglobin 11.6; Magnesium 2.2; Platelets 298; Potassium 4.1; Sodium 135; TSH 2.672  Recent Lipid Panel No results found for: "CHOL", "TRIG", "HDL", "CHOLHDL", "VLDL", "LDLCALC", "LDLDIRECT"   Physical Exam:    VS:  BP 122/68 (BP Location: Left Arm, Patient Position: Sitting, Cuff Size: Normal)   Pulse 79   Ht 5\' 3"  (1.6 m)   Wt 147 lb (66.7 kg)   SpO2 99%   BMI 26.04 kg/m     Wt Readings from Last 3 Encounters:  05/31/23 147 lb (66.7 kg)  12/02/22 145 lb 6.4 oz (66 kg)  08/07/21 148 lb 9.6 oz (67.4 kg)     GEN:  Well nourished, well developed in no acute distress HEENT: Normal NECK: No JVD; No carotid bruits LYMPHATICS: No lymphadenopathy CARDIAC: RRR, no murmurs, rubs, gallops RESPIRATORY:  Clear to auscultation without rales, wheezing or rhonchi  ABDOMEN: Soft, non-tender, non-distended MUSCULOSKELETAL:  No edema; No deformity  SKIN: Warm and dry NEUROLOGIC:  Alert and oriented x 3 PSYCHIATRIC:  Normal affect   ASSESSMENT:    1. Palpitations   2. Chest pain of uncertain etiology    PLAN:    In order of problems listed above:  Palpitations EKG today showed NSR, no further PACs noted. She reports improved palpitations with decreasing caffeine intake. No further work-up at this time.  Chest pain She reports improved chest pain episodes. Chest pain overall sounds more atypical . She denies exertional symptoms. ETT in 2021 was normal. No further ischemic work-up at  this time.    Disposition: Follow up in 1 year(s) with MD/APP    Signed, Olympia Adelsberger David Stall, PA-C  05/31/2023 12:14 PM    [facility]

## 2023-10-25 DIAGNOSIS — S39012A Strain of muscle, fascia and tendon of lower back, initial encounter: Secondary | ICD-10-CM | POA: Diagnosis not present

## 2023-11-30 DIAGNOSIS — Z0000 Encounter for general adult medical examination without abnormal findings: Secondary | ICD-10-CM | POA: Diagnosis not present

## 2023-11-30 DIAGNOSIS — Z23 Encounter for immunization: Secondary | ICD-10-CM | POA: Diagnosis not present

## 2023-11-30 DIAGNOSIS — E785 Hyperlipidemia, unspecified: Secondary | ICD-10-CM | POA: Diagnosis not present

## 2023-12-02 ENCOUNTER — Telehealth: Payer: Self-pay

## 2023-12-02 NOTE — Telephone Encounter (Signed)
 Unable to reach Jane Price by phone, to scheduled a new patient referral we received, d/t voicemail is full. Will try again

## 2023-12-02 NOTE — Telephone Encounter (Signed)
 Spoke with [name] regarding her referral to GYN oncology. She has an appointment scheduled with Dr. Daisey Dryer on 01/09/24 at 10:30. Patient agrees to date and time. She has been provided with office address and location. She is also aware of our mask and visitor policy. Patient verbalized understanding and will call with any questions.

## 2023-12-07 DIAGNOSIS — M8588 Other specified disorders of bone density and structure, other site: Secondary | ICD-10-CM | POA: Diagnosis not present

## 2023-12-07 DIAGNOSIS — N958 Other specified menopausal and perimenopausal disorders: Secondary | ICD-10-CM | POA: Diagnosis not present

## 2023-12-07 DIAGNOSIS — Z1231 Encounter for screening mammogram for malignant neoplasm of breast: Secondary | ICD-10-CM | POA: Diagnosis not present

## 2023-12-07 DIAGNOSIS — E2839 Other primary ovarian failure: Secondary | ICD-10-CM | POA: Diagnosis not present

## 2024-01-06 ENCOUNTER — Telehealth: Payer: Self-pay | Admitting: Oncology

## 2024-01-06 NOTE — Telephone Encounter (Signed)
 Called [facility] to complete meaningful use.  She asked to cancel the appointment on Monday because she will be out of town.  Asked if she would like to reschedule and she said she will call the office when she is back in town.

## 2024-01-09 ENCOUNTER — Inpatient Hospital Stay: Admitting: Psychiatry

## 2024-01-09 DIAGNOSIS — D250 Submucous leiomyoma of uterus: Secondary | ICD-10-CM

## 2024-02-11 ENCOUNTER — Emergency Department (HOSPITAL_COMMUNITY)

## 2024-02-11 ENCOUNTER — Emergency Department (HOSPITAL_COMMUNITY)
Admission: EM | Admit: 2024-02-11 | Discharge: 2024-02-11 | Disposition: A | Discharge status: Home / Self Care | Attending: Emergency Medicine | Admitting: Emergency Medicine

## 2024-02-11 ENCOUNTER — Encounter (HOSPITAL_COMMUNITY): Payer: Self-pay

## 2024-02-11 ENCOUNTER — Other Ambulatory Visit (HOSPITAL_COMMUNITY)

## 2024-02-11 DIAGNOSIS — R1030 Lower abdominal pain, unspecified: Secondary | ICD-10-CM | POA: Diagnosis not present

## 2024-02-11 DIAGNOSIS — Z79899 Other long term (current) drug therapy: Secondary | ICD-10-CM | POA: Insufficient documentation

## 2024-02-11 DIAGNOSIS — D259 Leiomyoma of uterus, unspecified: Secondary | ICD-10-CM | POA: Diagnosis not present

## 2024-02-11 DIAGNOSIS — Z7982 Long term (current) use of aspirin: Secondary | ICD-10-CM | POA: Diagnosis not present

## 2024-02-11 DIAGNOSIS — I10 Essential (primary) hypertension: Secondary | ICD-10-CM | POA: Insufficient documentation

## 2024-02-11 DIAGNOSIS — D649 Anemia, unspecified: Secondary | ICD-10-CM | POA: Diagnosis not present

## 2024-02-11 DIAGNOSIS — K449 Diaphragmatic hernia without obstruction or gangrene: Secondary | ICD-10-CM | POA: Diagnosis not present

## 2024-02-11 DIAGNOSIS — K5730 Diverticulosis of large intestine without perforation or abscess without bleeding: Secondary | ICD-10-CM | POA: Diagnosis not present

## 2024-02-11 DIAGNOSIS — R188 Other ascites: Secondary | ICD-10-CM | POA: Diagnosis not present

## 2024-02-11 LAB — URINALYSIS, ROUTINE W REFLEX MICROSCOPIC
Bilirubin Urine: NEGATIVE
Glucose, UA: NEGATIVE mg/dL
Hgb urine dipstick: NEGATIVE
Ketones, ur: NEGATIVE mg/dL
Nitrite: NEGATIVE
Protein, ur: NEGATIVE mg/dL
Specific Gravity, Urine: 1.002 — ABNORMAL LOW (ref 1.005–1.030)
pH: 7 (ref 5.0–8.0)

## 2024-02-11 LAB — COMPREHENSIVE METABOLIC PANEL WITH GFR
ALT: 18 U/L (ref 0–44)
AST: 25 U/L (ref 15–41)
Albumin: 4.5 g/dL (ref 3.5–5.0)
Alkaline Phosphatase: 74 U/L (ref 38–126)
Anion gap: 11 (ref 5–15)
BUN: 10 mg/dL (ref 8–23)
CO2: 24 mmol/L (ref 22–32)
Calcium: 9.4 mg/dL (ref 8.9–10.3)
Chloride: 106 mmol/L (ref 98–111)
Creatinine, Ser: 0.68 mg/dL (ref 0.44–1.00)
GFR, Estimated: >60 mL/min (ref >60)
Glucose, Bld: 102 mg/dL — ABNORMAL HIGH (ref 70–99)
Potassium: 3.5 mmol/L (ref 3.5–5.1)
Sodium: 141 mmol/L (ref 135–145)
Total Bilirubin: 0.4 mg/dL (ref 0.0–1.2)
Total Protein: 7.6 g/dL (ref 6.5–8.1)

## 2024-02-11 LAB — CBC
HCT: 37.8 % (ref 36.0–46.0)
Hemoglobin: 11.9 g/dL — ABNORMAL LOW (ref 12.0–15.0)
MCH: 27.9 pg (ref 26.0–34.0)
MCHC: 31.5 g/dL (ref 30.0–36.0)
MCV: 88.7 fL (ref 80.0–100.0)
Platelets: 330 K/uL (ref 150–400)
RBC: 4.26 MIL/uL (ref 3.87–5.11)
RDW: 15.1 % (ref 11.5–15.5)
WBC: 7.3 K/uL (ref 4.0–10.5)
nRBC: 0 % (ref 0.0–0.2)

## 2024-02-11 LAB — LIPASE, BLOOD: Lipase: 40 U/L (ref 11–51)

## 2024-02-11 MED ORDER — IOHEXOL 300 MG/ML  SOLN
100.0000 mL | Freq: Once | INTRAMUSCULAR | Status: AC | PRN
  Administered 2024-02-11: 100 mL via INTRAVENOUS

## 2024-02-11 MED ORDER — HYDROCODONE-ACETAMINOPHEN 5-325 MG PO TABS: 1.0000 | ORAL_TABLET | Freq: Four times a day (QID) | ORAL | 0 refills | Status: AC | PRN

## 2024-02-11 MED ORDER — KETOROLAC TROMETHAMINE 15 MG/ML IJ SOLN
15.0000 mg | Freq: Once | INTRAMUSCULAR | Status: AC
Start: 2024-02-11 — End: 2024-02-11
  Administered 2024-02-11: 15 mg via INTRAVENOUS
  Filled 2024-02-11: qty 1

## 2024-02-11 NOTE — Discharge Instructions (Addendum)
 Please use Tylenol  or ibuprofen for pain.  You may use 600 mg ibuprofen every 6 hours or 1000 mg of Tylenol  every 6 hours.  You may choose to alternate between the 2.  This would be most effective.  Not to exceed 4 g of Tylenol  within 24 hours.  Not to exceed 3200 mg ibuprofen 24 hours.  You can use the stronger narcotic pain medication in place of Tylenol  for severe break through pain.  If you take the narcotic pain medication that we prescribed recommend that you also take a laxative such as MiraLAX or Dulcolax every day that you take the narcotic pain medicine, and drink plenty of fluids, 50 to 64 ounces to prevent any constipation.  Please schedule an appointment to follow-up with the OB/GYN's contact formation provided above as previously planned.  Please return if you have significant worsening pain despite treatment.

## 2024-02-11 NOTE — ED Triage Notes (Signed)
 Pt arrived reporting lower abdominal pain since yesterday with nausea. No vomiting. No other symptoms reported. Hx of hernia and fibroids.

## 2024-02-11 NOTE — ED Provider Notes (Signed)
 [facility] AT [facility] Provider Note   CSN: [id number] Arrival date & time: 02/11/24  1124     Patient presents with: Abdominal Pain and Nausea   Jane Price is a 68 y.o. female past medical history significant for hypertension, fibroids, ventral hernia who presents concern for lower abdominal pain since yesterday with some nausea.  No vomiting.  No diarrhea, no constipation.  Reports the pain was more severe, 7/10 last night.  She reports she took a home oxycodone with some relief.  Reports the pain is somewhat improved today but worse in certain positions.  Feels like it is under her fibroids.    Abdominal Pain      Prior to Admission medications   Medication Sig Start Date End Date Taking? Authorizing Provider  HYDROcodone -acetaminophen  (NORCO/VICODIN) 5-325 MG tablet Take 1 tablet by mouth every 6 (six) hours as needed. 02/11/24  Yes Torrell Krutz H, PA-C  albuterol (PROVENTIL HFA;VENTOLIN HFA) 108 (90 Base) MCG/ACT inhaler Inhale 1 puff into the lungs every 6 (six) hours as needed for wheezing or shortness of breath.    [provider]  aspirin -acetaminophen -caffeine (EXCEDRIN MIGRAINE) 250-250-65 MG tablet Take by mouth every 6 (six) hours as needed for headache.    [provider]  ciclopirox (PENLAC) 8 % solution SMARTSIG:1 Milliliter(s) Topical Every Night Patient not taking: Reported on 05/31/2023 09/14/21   [provider]  fexofenadine (ALLEGRA) 180 MG tablet Take 180 mg by mouth at bedtime.    [provider]  fluticasone (FLONASE) 50 MCG/ACT nasal spray Place 1 spray into both nostrils daily.    [provider]  glucosamine-chondroitin 500-400 MG tablet Take 1 tablet by mouth daily.    [provider]  ibuprofen (ADVIL) 200 MG tablet Take 400 mg by mouth every 6 (six) hours as needed (Muscle pain). Patient not taking: Reported on 05/31/2023    [provider]   Multiple Vitamin (MULTIVITAMIN) tablet Take 1 tablet by mouth daily.    [provider]  mupirocin ointment (BACTROBAN) 2 % SMARTSIG:1 Application Topical 2-3 Times Daily Patient not taking: Reported on 05/31/2023 09/14/21   [provider]  Olopatadine HCl 0.7 % SOLN Place 1 drop into both eyes daily.    [provider]  rizatriptan (MAXALT-MLT) 10 MG disintegrating tablet Take 5-10 mg by mouth as needed for migraine.    [provider]  sodium fluoride (PREVIDENT 5000 PLUS) 1.1 % CREA dental cream Place 1 application onto teeth in the morning and at bedtime. 01/07/21   [provider]    Allergies: Egg-derived products, Erythromycin base, Other, Rice, Sulfa antibiotics, Ciprofloxacin, Dairycare [bacid], Doxycycline, Gluten meal, Metronidazole, Nasacort [triamcinolone], Soy allergy (obsolete), Tobramycin, Penicillins, and Sulfamethoxazole    Review of Systems  Gastrointestinal:  Positive for abdominal pain.  All other systems reviewed and are negative.   Updated Vital Signs BP (!) 167/86   Pulse 69   Temp 98.4 F (36.9 C) (Oral)   Resp 18   Ht 5' 3 (1.6 m)   Wt 66.7 kg   SpO2 99%   BMI 26.05 kg/m   Physical Exam Vitals and nursing note reviewed.  Constitutional:      General: She is not in acute distress.    Appearance: Normal appearance.  HENT:     Head: Normocephalic and atraumatic.  Eyes:     General:        Right eye: No discharge.        Left eye:  No discharge.  Cardiovascular:     Rate and Rhythm: Normal rate and regular rhythm.     Heart sounds: No murmur heard.    No friction rub. No gallop.  Pulmonary:     Effort: Pulmonary effort is normal.     Breath sounds: Normal breath sounds.  Abdominal:     General: Bowel sounds are normal.     Palpations: Abdomen is soft.     Comments: Large ventral hernia, palpable large uterine fibroids, no rebound, rigidity, guarding, no evidence of incarceration, strangulation noted on  my exam.  Skin:    General: Skin is warm and dry.     Capillary Refill: Capillary refill takes less than 2 seconds.  Neurological:     Mental Status: She is alert and oriented to person, place, and time.  Psychiatric:        Mood and Affect: Mood normal.        Behavior: Behavior normal.     (all labs ordered are listed, but only abnormal results are displayed) Labs Reviewed  CBC - Abnormal; Notable for the following components:      Result Value   Hemoglobin 11.9 (*)    All other components within normal limits  COMPREHENSIVE METABOLIC PANEL WITH GFR - Abnormal; Notable for the following components:   Glucose, Bld 102 (*)    All other components within normal limits  URINALYSIS, ROUTINE W REFLEX MICROSCOPIC - Abnormal; Notable for the following components:   Color, Urine COLORLESS (*)    Specific Gravity, Urine 1.002 (*)    Leukocytes,Ua MODERATE (*)    Bacteria, UA RARE (*)    Crystals PRESENT (*)    All other components within normal limits  LIPASE, BLOOD    EKG: None  Radiology: CT ABDOMEN PELVIS W CONTRAST Result Date: 02/11/2024 CLINICAL DATA:  Lower abdominal pain with nausea. EXAM: CT ABDOMEN AND PELVIS WITH CONTRAST TECHNIQUE: Multidetector CT imaging of the abdomen and pelvis was performed using the standard protocol following bolus administration of intravenous contrast. RADIATION DOSE REDUCTION: This exam was performed according to the departmental dose-optimization program which includes automated exposure control, adjustment of the mA and/or kV according to patient size and/or use of iterative reconstruction technique. CONTRAST:  OMNIPAQUE  IOHEXOL  300 MG/ML  SOLN COMPARISON:  07/12/2021. FINDINGS: Lower chest: Stable nodular opacities are noted in the left lower lobe measuring up to 8 mm, axial image 11, unchanged from 2020 and likely benign. Hepatobiliary: No focal liver abnormality is seen. No gallstones, gallbladder wall thickening, or biliary dilatation.  Pancreas: Unremarkable. No pancreatic ductal dilatation or surrounding inflammatory changes. Spleen: A subcentimeter hypodensity is noted in the spleen, likely cyst or hemangioma. Adrenals/Urinary Tract: The adrenal glands are within normal limits. The kidneys enhance symmetrically. A fat attenuation hypodensity is noted in the lower pole of the left kidney, likely angiomyolipoma. Additional subcentimeter hypodensities are noted in the kidneys bilaterally which are too small to further characterize. No renal calculus or hydronephrosis bilaterally. The bladder is unremarkable. Stomach/Bowel: There is a small hiatal hernia. No bowel obstruction, free air, or pneumatosis is seen. Scattered diverticula are noted along the colon without evidence of diverticulitis. Appendix is not seen. Vascular/Lymphatic: No significant vascular findings are present. No enlarged abdominal or pelvic lymph nodes. Reproductive: The uterus is markedly enlarged with a large fibroid displacing multiple loops of bowel, not significantly changed from the prior exam. No adnexal mass is seen. Other: Multifocal fat containing periumbilical hernias are identified. No ascites. A cystic structure  is noted in the subcutaneous tissues in the low anterior abdominal wall measuring 3.3 cm, slightly increased in size from 2022. Musculoskeletal: Degenerative changes are present in the thoracolumbar spine. No acute osseous abnormality is seen. IMPRESSION: 1. No acute intra-abdominal process. 2. Small hiatal hernia. 3. Enlarged fibroid uterus, unchanged from multiple prior exams. 4. Stable left lower lobe pulmonary nodules measuring up to 8 mm, unchanged from 2020 and likely benign. 5. Subcutaneous fluid collection in the low anterior abdominal wall measuring 3.3 cm, slightly increased from 2022. Electronically Signed   By: Leita Birmingham M.D.   On: 02/11/2024 14:08     Procedures   Medications Ordered in the ED  ketorolac  (TORADOL ) 15 MG/ML injection 15  mg (15 mg Intravenous Given 02/11/24 1211)  iohexol  (OMNIPAQUE ) 300 MG/ML solution 100 mL (100 mLs Intravenous Contrast Given 02/11/24 1340)                                    Medical Decision Making  This patient is a 68 y.o. female  who presents to the ED for concern of abdominal pain.   Differential diagnoses prior to evaluation: The emergent differential diagnosis includes, but is not limited to,  The causes of generalized abdominal pain include but are not limited to AAA, mesenteric ischemia, appendicitis, diverticulitis, DKA, gastritis, gastroenteritis, AMI, nephrolithiasis, pancreatitis, peritonitis, adrenal insufficiency,lead poisoning, iron toxicity, intestinal ischemia, constipation, UTI,SBO/LBO, splenic rupture, biliary disease, IBD, IBS, PUD, or hepatitis --suspect complication related to fibroids uterus or known hernia. This is not an exhaustive differential.   Past Medical History / Co-morbidities / Social History: Ventral hernia, fibroids  Additional history: Chart reviewed. Pertinent results include: reviewed labwork, imaging, and previous outpatient OBGYN evaluations  Physical Exam: Physical exam performed. The pertinent findings include: Large ventral hernia, palpable large uterine fibroids, no rebound, rigidity, guarding, no evidence of incarceration, strangulation noted on my exam.   Lab Tests/Imaging studies: I personally interpreted labs/imaging and the pertinent results include: CBC notable for mild anemia, hemoglobin 1.9, CMP overall unremarkable, lipase normal, UA does have some moderate leukocytes, rare bacteria, crystals, but she is not having any dysuria or acute urinary symptoms, do not suspect that this is an infected urine sample.  I independently turbid CT abdomen pelvis with contrast which shows large fibroid uterus, ventral hernia, stable to slightly increased fluid collection which is likely cyst associated with her fibroid uterus, no evidence of acute  abnormality to explain her pain other than her known fibroids and hernia.  No incarceration or strangulation. I agree with the radiologist interpretation.   Medications: I ordered medication including Toradol  for pain.  I have reviewed the patients home medicines and have made adjustments as needed.   After discussion with patient I think reasonable to discharge with a short course of pain medicine to help with pain related to her fibroids and ventral hernia  Disposition: After consideration of the diagnostic results and the patients response to treatment, I feel that patient stable for discharge, encouraged her to follow-up with OB/GYN as previously planned, no other acute treatment indicated at this time.   emergency department workup does not suggest an emergent condition requiring admission or immediate intervention beyond what has been performed at this time. The plan is: as above. The patient is safe for discharge and has been instructed to return immediately for worsening symptoms, change in symptoms or any other concerns.   Final diagnoses:  Uterine leiomyoma, unspecified location  Lower abdominal pain    ED Discharge Orders          Ordered    HYDROcodone -acetaminophen  (NORCO/VICODIN) 5-325 MG tablet  Every 6 hours PRN        02/11/24 1459               Khai Torbert, Sherlean DEL, PA-C 02/11/24 1503    Laurice Maude BROCKS, MD 02/12/24 1600

## 2024-02-24 DIAGNOSIS — R21 Rash and other nonspecific skin eruption: Secondary | ICD-10-CM | POA: Diagnosis not present

## 2024-02-24 DIAGNOSIS — K469 Unspecified abdominal hernia without obstruction or gangrene: Secondary | ICD-10-CM | POA: Diagnosis not present

## 2024-03-01 ENCOUNTER — Telehealth: Payer: Self-pay

## 2024-03-01 NOTE — Telephone Encounter (Signed)
 Spoke with the patient regarding the referral to GYN oncology. Patient scheduled as new patient with Dr Eldonna on 03/19/2024. Patient given an arrival time of 9:15am.  Explained to the patient the the doctor will perform a pelvic exam at this visit. Patient given the policy that only one visitor allowed and that visitor must be over 16 yrs are allowed in the Cancer Center. Patient given the address/phone number for the clinic and that the center offers free valet service. Patient aware that masks required.

## 2024-03-16 ENCOUNTER — Encounter: Payer: Self-pay | Admitting: Psychiatry

## 2024-03-19 ENCOUNTER — Encounter: Payer: Self-pay | Admitting: Psychiatry

## 2024-03-19 ENCOUNTER — Inpatient Hospital Stay: Attending: Psychiatry | Admitting: Psychiatry

## 2024-03-19 VITALS — BP 148/86 | HR 74 | Temp 98.1°F | Resp 18 | Ht 63.0 in | Wt 144.0 lb

## 2024-03-19 DIAGNOSIS — D259 Leiomyoma of uterus, unspecified: Secondary | ICD-10-CM | POA: Diagnosis not present

## 2024-03-19 DIAGNOSIS — R918 Other nonspecific abnormal finding of lung field: Secondary | ICD-10-CM | POA: Diagnosis not present

## 2024-03-19 DIAGNOSIS — D250 Submucous leiomyoma of uterus: Secondary | ICD-10-CM

## 2024-03-19 DIAGNOSIS — K429 Umbilical hernia without obstruction or gangrene: Secondary | ICD-10-CM | POA: Diagnosis not present

## 2024-03-19 NOTE — Progress Notes (Signed)
 GYNECOLOGIC ONCOLOGY NEW PATIENT CONSULTATION  Date of Service: 03/19/2024 Referring Provider: Claudene Pellet, MD [phone] [address] [place],  [place] [address]   ASSESSMENT AND PLAN: Jane Price is a 68 y.o. woman with enlarged fibroid uterus.  Reviewed her history today in detail.  I reviewed her imaging findings.  Uterine size has been overall stable for the past 5 years.  Reviewed pros and cons of removal.  Reviewed prior surgical plans.  Discussed that the goal of uterine artery embolization would be for aiding in blood loss at time of surgery.  However, do not have to proceed with this procedure.  Reviewed increased risk of blood loss due to the size of her uterus and risk of needing blood transfusion.  Reviewed risks of blood transfusion.  At this time, reviewed that I would proceed with an abdominal hysterectomy, removal of bilateral fallopian tubes.  Reviewed that at her age I would recommend removal of bilateral ovaries as well.  Patient has previously expressed and again affirmed today that she would prefer not to have her ovaries removed.  Reviewed risk of reoperation.  Additionally reviewed potential joint procedure for treating hernias at time of surgery.  Would be willing to again try to coordinate a joint procedure.  Risks of surgery reviewed including risks that differ between open hysterectomy and minimally invasive hysterectomy.  At this time, tentative surgical plan would be for a total abdominal hysterectomy, bilateral salpingectomy with joint procedure for hernia repair.  Patient would like to consider this further but is open to proceeding.  Plan for patient to return in a few weeks to confirm if she feels comfortable proceeding with this plan and then we will proceed with trying to coordinate timing with Dr. Lyndel .  A copy of this note was sent to the patient's referring provider.  Hoy Masters, MD Gynecologic Oncology   Medical Decision  Making I personally spent  TOTAL 60 minutes face-to-face and non-face-to-face in the care of this patient, which includes all pre, intra, and post visit time on the date of service.   ------------  CC: Uterine fibroids  HISTORY OF PRESENT ILLNESS:  Jane Price is a 68 y.o. woman who is seen in consultation at the request of Claudene Pellet, MD for evaluation of uterine fibroids.  Patient has previously been seen in this clinic dating back to 06/06/2019 when she was first seen by Dr. Maurilio Ship.  At that time she was evaluated for a large fibroid uterus, approximately 30 cm.  Surgery was discussed.  Patient has had various reservations of the years and has scheduled and canceled surgery at various times.  She was last seen by Dr. Viktoria on 08/07/2021 with plan for preoperative embolization and concurrent procedure with Dr. Lyndel for hernia repair.  She did not ultimately proceed with surgery and has not been seen in our clinic since that time.  Patient was last seen by sure provider with [facility] physicians on 11/30/2023.  At that time it was discussed referring her back with consideration of seeing another provider.  Of note, she has had a positive FIT test and declined workup in the past and again declined workup at that visit.  Her most recent imaging was on 02/11/2024 in the setting of presenting to the emergency room for abdominal pain and nausea.  On exam she was noted to have a large fibroid uterus that was not significantly changed in size from prior exams.  She also had stable left lower lobe pulmonary  nodules measuring up to 8 mm unchanged from 2020 as well as multifocal fat-containing periumbilical hernias.  Today patient reports she has significant anxiety surrounding her prior experience with her daughter's delivery.  She has mistrust particularly with OB/GYNs in the setting of this.  Reports that she develops increasing anxiety as it gets closer to her procedure as she considers the  risks more heavily compared to the benefits.  She reports that currently her daughter is on a ventilator and they are working on weaning her from that.  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  Currently, she reports that she feels the mass in particular with different positioning.  Had pain in July for which she presented to emergency room, but the pain eventually went away on its own and had significantly improved at the time of her ER visit.  Feels that her hernia is getting worse and worse, and feels that this is most limiting to her activities.  Denies constipation.  Reports occasional urinary frequency but this is stable and she feels used to it.  No issues with eating or drinking.  Does have restricted diet from food sensitivities.  Denies significant weight loss.    PAST MEDICAL HISTORY: Past Medical History:  Diagnosis Date   Abdominal mass 04/2019   CT scan    Allergic rhinitis    Atherosclerosis of aorta (HCC)    Atherosclerosis of coronary artery    Fibroids    Fibroids    Hypertension    Lung nodules 04/2019   Migraines    PTSD (post-traumatic stress disorder)    Sinus tachycardia    Uterine fibroid     PAST SURGICAL HISTORY: Past Surgical History:  Procedure Laterality Date   ADENOIDECTOMY     CESAREAN SECTION     x2   IR RADIOLOGIST EVAL & MGMT  08/14/2021   TONSILLECTOMY      OB/GYN HISTORY: OB History  Gravida Para Term Preterm AB Living  2 2 2   2   SAB IAB Ectopic Multiple Live Births      2    # Outcome Date GA Lbr Len/2nd Weight Sex Type Anes PTL Lv  2 Term      CS-Unspec   LIV  1 Term      CS-Unspec   LIV      Age at menarche: 18 Age at menopause: 22 Hx of HRT: no Hx of STI: no Last pap: 05/03/17 NILM History of abnormal pap smears: no  SCREENING STUDIES:  Last mammogram: 10/2023 Last colonoscopy: none, abnormal FIT test  MEDICATIONS:  Current  Outpatient Medications:    albuterol (PROVENTIL HFA;VENTOLIN HFA) 108 (90 Base) MCG/ACT inhaler, Inhale 1 puff into the lungs every 6 (six) hours as needed for wheezing or shortness of breath., Disp: , Rfl:    aspirin -acetaminophen -caffeine (EXCEDRIN MIGRAINE) 250-250-65 MG tablet, Take by mouth every 6 (six) hours as needed for headache., Disp: , Rfl:    famotidine (PEPCID) 20 MG tablet, 1 tablet as needed Orally Once a day, Disp: , Rfl:    fexofenadine (ALLEGRA) 180 MG tablet, Take 180 mg by mouth at bedtime., Disp: , Rfl:    fluticasone (FLONASE) 50 MCG/ACT nasal spray, Place 1 spray into both nostrils daily., Disp: , Rfl:    glucosamine-chondroitin 500-400 MG tablet, Take 1 tablet by mouth daily., Disp: , Rfl:    HYDROcodone -acetaminophen  (NORCO/VICODIN) 5-325 MG tablet, Take 1  tablet by mouth every 6 (six) hours as needed., Disp: 15 tablet, Rfl: 0   Multiple Vitamin (MULTIVITAMIN) tablet, Take 1 tablet by mouth daily., Disp: , Rfl:    Olopatadine HCl 0.7 % SOLN, Place 1 drop into both eyes daily., Disp: , Rfl:    rizatriptan (MAXALT-MLT) 10 MG disintegrating tablet, Take 5-10 mg by mouth as needed for migraine., Disp: , Rfl:    sodium fluoride (PREVIDENT 5000 PLUS) 1.1 % CREA dental cream, Place 1 application onto teeth in the morning and at bedtime., Disp: , Rfl:   ALLERGIES: Allergies  Allergen Reactions   Egg-Derived Products Anaphylaxis   Erythromycin Base Other (See Comments)    Headache   Other Other (See Comments)    Asparagus, broccoli, and cauliflower    Rice Anaphylaxis   Sulfa Antibiotics     Other Reaction(s): dizziness   Ciprofloxacin     Fever   Dairycare [Bacid] Other (See Comments)    IBS   Doxycycline     headache   Gluten Meal Other (See Comments)    IBS   Metronidazole     bruising   Nasacort [Triamcinolone] Other (See Comments)    Nose bleeds and headache   Soy Allergy (Obsolete) Other (See Comments)    IBS   Tobramycin     headache   Penicillins Rash    Sulfamethoxazole Rash    FAMILY HISTORY: Family History  Problem Relation Age of Onset   COPD Mother    Breast cancer Sister        38s   Breast cancer Sister        12s   Ovarian cancer Neg Hx    Uterine cancer Neg Hx    Colon cancer Neg Hx     SOCIAL HISTORY: Social History   Socioeconomic History   Marital status: Married    Spouse name: Not on file   Number of children: Not on file   Years of education: Not on file   Highest education level: Not on file  Occupational History   Not on file  Tobacco Use   Smoking status: Never   Smokeless tobacco: Never  Vaping Use   Vaping status: Never Used  Substance and Sexual Activity   Alcohol use: Never   Drug use: Never   Sexual activity: Not Currently  Other Topics Concern   Not on file  Social History Narrative   Not on file   Social Drivers of Health   Financial Resource Strain: Not on file  Food Insecurity: No Food Insecurity (03/16/2024)   Hunger Vital Sign    Worried About Running Out of Food in the Last Year: Never true    Ran Out of Food in the Last Year: Never true  Transportation Needs: No Transportation Needs (03/16/2024)   PRAPARE - Administrator, Civil Service (Medical): No    Lack of Transportation (Non-Medical): No  Physical Activity: Not on file  Stress: Not on file  Social Connections: Not on file  Intimate Partner Violence: Not At Risk (03/16/2024)   Humiliation, Afraid, Rape, and Kick questionnaire    Fear of Current or Ex-Partner: No    Emotionally Abused: No    Physically Abused: No    Sexually Abused: No    REVIEW OF SYSTEMS: New patient intake form was reviewed.  Complete 10-system review is negative except for the following: none  PHYSICAL EXAM: BP (!) 155/79 (BP Location: Left Arm, Patient Position: Sitting)   Pulse 74  Temp 98.1 F (36.7 C) (Oral)   Resp 18   Ht 5' 3 (1.6 m)   Wt 144 lb (65.3 kg)   SpO2 98%   BMI 25.51 kg/m  Constitutional: No acute  distress. Neuro/Psych: Alert, oriented.  Exam deferred and lieu of extensive discussion  LABORATORY AND RADIOLOGIC DATA: Outside medical records were reviewed to synthesize the above history, along with the history and physical obtained during the visit.  Outside laboratory, pathology, and imaging reports were reviewed, with pertinent results below.  I personally reviewed the outside images.  WBC  Date Value Ref Range Status  02/11/2024 7.3 4.0 - 10.5 K/uL Final   Hemoglobin  Date Value Ref Range Status  02/11/2024 11.9 (L) 12.0 - 15.0 g/dL Final   HCT  Date Value Ref Range Status  02/11/2024 37.8 36.0 - 46.0 % Final   Platelets  Date Value Ref Range Status  02/11/2024 330 150 - 400 K/uL Final   Magnesium  Date Value Ref Range Status  12/02/2022 2.2 1.7 - 2.4 mg/dL Final    Comment:    Performed at [facility], [address] [place], [place] [address]   Creatinine, Ser  Date Value Ref Range Status  02/11/2024 0.68 0.44 - 1.00 mg/dL Final   AST  Date Value Ref Range Status  02/11/2024 25 15 - 41 U/L Final   ALT  Date Value Ref Range Status  02/11/2024 18 0 - 44 U/L Final   Diagnosis  Date Value Ref Range Status  05/03/2017   Final   NEGATIVE FOR INTRAEPITHELIAL LESIONS OR MALIGNANCY.    CT ABDOMEN PELVIS W CONTRAST 02/11/2024  Narrative CLINICAL DATA:  Lower abdominal pain with nausea.  EXAM: CT ABDOMEN AND PELVIS WITH CONTRAST  TECHNIQUE: Multidetector CT imaging of the abdomen and pelvis was performed using the standard protocol following bolus administration of intravenous contrast.  RADIATION DOSE REDUCTION: This exam was performed according to the departmental dose-optimization program which includes automated exposure control, adjustment of the mA and/or kV according to patient size and/or use of iterative reconstruction technique.  CONTRAST:  OMNIPAQUE  IOHEXOL  300 MG/ML  SOLN  COMPARISON:  07/12/2021.  FINDINGS: Lower  chest: Stable nodular opacities are noted in the left lower lobe measuring up to 8 mm, axial image 11, unchanged from 2020 and likely benign.  Hepatobiliary: No focal liver abnormality is seen. No gallstones, gallbladder wall thickening, or biliary dilatation.  Pancreas: Unremarkable. No pancreatic ductal dilatation or surrounding inflammatory changes.  Spleen: A subcentimeter hypodensity is noted in the spleen, likely cyst or hemangioma.  Adrenals/Urinary Tract: The adrenal glands are within normal limits. The kidneys enhance symmetrically. A fat attenuation hypodensity is noted in the lower pole of the left kidney, likely angiomyolipoma. Additional subcentimeter hypodensities are noted in the kidneys bilaterally which are too small to further characterize. No renal calculus or hydronephrosis bilaterally. The bladder is unremarkable.  Stomach/Bowel: There is a small hiatal hernia. No bowel obstruction, free air, or pneumatosis is seen. Scattered diverticula are noted along the colon without evidence of diverticulitis. Appendix is not seen.  Vascular/Lymphatic: No significant vascular findings are present. No enlarged abdominal or pelvic lymph nodes.  Reproductive: The uterus is markedly enlarged with a large fibroid displacing multiple loops of bowel, not significantly changed from the prior exam. No adnexal mass is seen.  Other: Multifocal fat containing periumbilical hernias are identified. No ascites. A cystic structure is noted in the subcutaneous tissues in the low anterior abdominal wall measuring 3.3  cm, slightly increased in size from 2022.  Musculoskeletal: Degenerative changes are present in the thoracolumbar spine. No acute osseous abnormality is seen.  IMPRESSION: 1. No acute intra-abdominal process. 2. Small hiatal hernia. 3. Enlarged fibroid uterus, unchanged from multiple prior exams. 4. Stable left lower lobe pulmonary nodules measuring up to 8  mm, unchanged from 2020 and likely benign. 5. Subcutaneous fluid collection in the low anterior abdominal wall measuring 3.3 cm, slightly increased from 2022.   Electronically Signed By: Leita Birmingham M.D. On: 02/11/2024 14:08

## 2024-03-19 NOTE — Patient Instructions (Signed)
 It was a pleasure to see you in clinic today. - We discussed consideration of open hysterectomy and removal of fallopian tubes. We discussed coordinating this with Dr. Lyndel for hernia repair. - Return visit planned for a few weeks  Thank you very much for allowing me to provide care for you today.  I appreciate your confidence in choosing our Gynecologic Oncology team at [facility].  If you have any questions about your visit today please call our office or send us  a MyChart message and we will get back to you as soon as possible.

## 2024-03-28 ENCOUNTER — Encounter: Payer: Self-pay | Admitting: Family Medicine

## 2024-04-23 ENCOUNTER — Inpatient Hospital Stay: Attending: Psychiatry | Admitting: Psychiatry

## 2024-04-23 DIAGNOSIS — D252 Subserosal leiomyoma of uterus: Secondary | ICD-10-CM

## 2024-04-23 NOTE — Progress Notes (Unsigned)
 This encounter was created in error - please disregard (pt no show).

## 2024-07-30 NOTE — Progress Notes (Deleted)
" °  Cardiology Office Note:  .   Date:  07/30/2024  ID:  [name], DOB 1956-05-02, MRN [id number] PCP: Claudene Pellet, MD  [facility] Providers Cardiologist:  Lonni Hanson, MD { Click to update primary MD,subspecialty MD or APP then REFRESH:1}    History of Present Illness: .   Jane Price is a 68 y.o. female with history of hypertension, PTSD, and uterine fibroids, who presents for follow-up of palpitations and chest pain.  She was last seen in our office in 05/2023 by Cadence Furth, PA, at which time she reported improvement in palpitations and chest pain.  No further workup or intervention was recommended at that time.  ROS: See HPI  Studies Reviewed: .        *** Risk Assessment/Calculations:   {Does this patient have ATRIAL FIBRILLATION?:[phone]} No BP recorded.  {Refresh Note OR Click here to enter BP  :1}***       Physical Exam:   VS:  There were no vitals taken for this visit.   Wt Readings from Last 3 Encounters:  03/19/24 144 lb (65.3 kg)  02/11/24 147 lb 0.8 oz (66.7 kg)  05/31/23 147 lb (66.7 kg)    General:  NAD. Neck: No JVD or HJR. Lungs: Clear to auscultation bilaterally without wheezes or crackles. Heart: Regular rate and rhythm without murmurs, rubs, or gallops. Abdomen: Soft, nontender, nondistended. Extremities: No lower extremity edema.  ASSESSMENT AND PLAN: .    ***    {Are you ordering a CV Procedure (e.g. stress test, cath, DCCV, TEE, etc)?   Press F2        :[id number]}  Dispo: ***  Signed, Lonni Hanson, MD  "

## 2024-07-31 DIAGNOSIS — M62830 Muscle spasm of back: Secondary | ICD-10-CM | POA: Diagnosis not present

## 2024-08-01 ENCOUNTER — Ambulatory Visit: Admitting: Internal Medicine

## 2024-08-15 ENCOUNTER — Encounter: Payer: Self-pay | Admitting: Internal Medicine

## 2024-08-15 ENCOUNTER — Ambulatory Visit: Attending: Internal Medicine | Admitting: Internal Medicine

## 2024-08-15 VITALS — BP 148/84 | HR 70 | Ht 63.0 in | Wt 148.8 lb

## 2024-08-15 DIAGNOSIS — I2510 Atherosclerotic heart disease of native coronary artery without angina pectoris: Secondary | ICD-10-CM | POA: Diagnosis not present

## 2024-08-15 DIAGNOSIS — R002 Palpitations: Secondary | ICD-10-CM | POA: Diagnosis not present

## 2024-08-15 DIAGNOSIS — Z79899 Other long term (current) drug therapy: Secondary | ICD-10-CM | POA: Diagnosis not present

## 2024-08-15 DIAGNOSIS — R072 Precordial pain: Secondary | ICD-10-CM | POA: Diagnosis present

## 2024-08-15 DIAGNOSIS — R011 Cardiac murmur, unspecified: Secondary | ICD-10-CM

## 2024-08-15 DIAGNOSIS — E785 Hyperlipidemia, unspecified: Secondary | ICD-10-CM | POA: Diagnosis not present

## 2024-08-15 MED ORDER — METOPROLOL TARTRATE 50 MG PO TABS: ORAL_TABLET | ORAL | 0 refills | Status: AC

## 2024-08-15 NOTE — Addendum Note (Signed)
 Addended by: DESIDERIO RUSSELL SAILOR on: 08/15/2024 02:06 PM   Modules accepted: Orders

## 2024-08-15 NOTE — Progress Notes (Signed)
 " Cardiology Office Note:  .   Date:  08/15/2024  ID:  [name], DOB 10-03-1955, MRN [id number] PCP: Claudene Pellet, MD  [facility] Providers Cardiologist:  Lonni Hanson, MD     History of Present Illness: .   Jane Price is a 69 y.o. female with history of hypertension, PTSD, and uterine fibroids, who presents for follow-up of palpitations and chest pain.  She was last seen in our office in 05/2023 by Cadence Furth, PA, at which time she reported improvement in palpitations and chest pain.  No further workup or intervention was recommended at that time.  Today, Jane Price reports that she has been feeling fairly well.  Her palpitations have generally been well-controlled with reduction in her caffeine consumption.  She notes at least 1 episode of sporadic chest pain that happened around Christmas.  She describes a vague pressure in the chest that lasted for the better part of the day while she was shopping.  It has not recurred since.  She has not had any other significant chest pain but notes some migratory back pain.  She was recently prescribed a muscle relaxant by her PCP but has not tolerated this due to headaches.  She is hoping to begin physical therapy for her back pain.  She denies shortness of breath, lightheadedness, and edema.  She has been considering undergoing excision +/- uterine of her large uterine fibroid.  She also notes that she may need hernia repair at some point.  ROS: See HPI  Studies Reviewed: .   EKG (08/15/2024): Normal sinus rhythm without abnormalities  Risk Assessment/Calculations:     HYPERTENSION CONTROL Vitals:   08/15/24 1042 08/15/24 1107  BP: (!) 150/80 (!) 148/84    The patient's blood pressure is elevated above target today.  In order to address the patient's elevated BP: Blood pressure will be monitored at home to determine if medication changes need to be made.          Physical Exam:   VS:  BP (!) 148/84   Pulse 70    Ht 5' 3 (1.6 m)   Wt 148 lb 12.8 oz (67.5 kg)   SpO2 97%   BMI 26.36 kg/m    Wt Readings from Last 3 Encounters:  08/15/24 148 lb 12.8 oz (67.5 kg)  03/19/24 144 lb (65.3 kg)  02/11/24 147 lb 0.8 oz (66.7 kg)    General:  NAD. Neck: No JVD or HJR. Lungs: Clear to auscultation bilaterally without wheezes or crackles. Heart: Regular rate and rhythm with 2/6 systolic murmur at the RUSB. Abdomen: Soft, nontender, nondistended. Extremities: No lower extremity edema.  ASSESSMENT AND PLAN: .    Chest pain, coronary artery calcification, and hyperlipidemia: Jane Price reports single episode of vague chest discomfort that lasted the better part of the day while she was shopping shortly before Christmas.  It has not recurred but she has some migratory back pain that is most likely musculoskeletal in nature.  She was noted to have coronary artery calcification on a CT of the chest in 2020.  Subsequent exercise tolerance test did not show evidence of myocardial ischemia.  We have discussed further evaluation options and have agreed to perform a coronary CTA, particularly given that she will need cardiac clearance before undergoing any intervention to her uterine fibroid.  We will also check a lipid panel and CMP today in anticipation of adding statin therapy if her LDL is above 100.  Heart murmur: Subtle systolic murmur appreciated  on examination today.  Though Jane Price does not report any heart failure symptoms, we have agreed to obtain an echocardiogram to evaluate for significant valvular heart disease.  Palpitations: Fairly quiescent with minimization of caffeine intake.  I have encouraged her to continue with this.    Dispo: Return to clinic in 6 weeks.  Signed, Lonni Hanson, MD  "

## 2024-08-15 NOTE — Patient Instructions (Signed)
 Medication Instructions:  Your physician recommends that you continue on your current medications as directed. Please refer to the Current Medication list given to you today.    *If you need a refill on your cardiac medications before your next appointment, please call your pharmacy*  Lab Work: Your provider would like for you to have following labs drawn today CMP, Lipid.     Testing/Procedures:   Your cardiac CT will be scheduled at one of the below locations:   [facility] [address] [place], [place] [address] [phone]  If scheduled at [facility] or [facility], please arrive 15 mins early for check-in and test prep.  There is spacious parking and easy access to the radiology department from the [facility] entrance. Please enter here and check-in with the desk attendant.   Please follow these instructions carefully (unless otherwise directed):  An IV will be required for this test and Nitroglycerin will be given.   On the Night Before the Test: Be sure to Drink plenty of water. Do not consume any caffeinated/decaffeinated beverages or chocolate 12 hours prior to your test. Do not take any antihistamines 12 hours prior to your test.  On the Day of the Test: Drink plenty of water until 1 hour prior to the test. Do not eat any food 1 hour prior to test. You may take your regular medications prior to the test.  Take metoprolol  (Lopressor ) 50 mg  two hours prior to test. Patients who wear a continuous glucose monitor MUST remove the device prior to scanning. FEMALES- please wear underwire-free bra if available, avoid dresses & tight clothing         After the Test: Drink plenty of water. After receiving IV contrast, you may experience a mild flushed feeling. This is normal. On occasion, you may experience a mild rash up to 24 hours after the test. This is not dangerous. If this  occurs, you can take Benadryl 25 mg, Zyrtec, Claritin, or Allegra and increase your fluid intake. (Patients taking Tikosyn should avoid Benadryl, and may take Zyrtec, Claritin, or Allegra) If you experience trouble breathing, this can be serious. If it is severe call 911 IMMEDIATELY. If it is mild, please call our office.  We will call to schedule your test 2-4 weeks out understanding that some insurance companies will need an authorization prior to the service being performed.   For more information and frequently asked questions, please visit our website : [email or web address]  For non-scheduling related questions, please contact the cardiac imaging nurse navigator should you have any questions/concerns: Cardiac Imaging Nurse Navigators Direct Office Dial: [phone]   For scheduling needs, including cancellations and rescheduling, please call Brittany, [phone].    Follow-Up: At [facility], you and your health needs are our priority.  As part of our continuing mission to provide you with exceptional heart care, our providers are all part of one team.  This team includes your primary Cardiologist (physician) and Advanced Practice Providers or APPs (Physician Assistants and Nurse Practitioners) who all work together to provide you with the care you need, when you need it.  Your next appointment:   6 week(s)  Provider:   Mikey Fishman, PA-C

## 2024-08-16 ENCOUNTER — Ambulatory Visit: Payer: Self-pay | Admitting: Internal Medicine

## 2024-08-16 LAB — LIPID PANEL
Chol/HDL Ratio: 4.1 ratio (ref 0.0–4.4)
Cholesterol, Total: 218 mg/dL — ABNORMAL HIGH (ref 100–199)
HDL: 53 mg/dL
LDL Chol Calc (NIH): 152 mg/dL — ABNORMAL HIGH (ref 0–99)
Triglycerides: 76 mg/dL (ref 0–149)
VLDL Cholesterol Cal: 13 mg/dL (ref 5–40)

## 2024-08-16 LAB — COMPREHENSIVE METABOLIC PANEL WITH GFR
ALT: 20 IU/L (ref 0–32)
AST: 26 IU/L (ref 0–40)
Albumin: 4.6 g/dL (ref 3.9–4.9)
Alkaline Phosphatase: 91 IU/L (ref 49–135)
BUN/Creatinine Ratio: 19 (ref 12–28)
BUN: 15 mg/dL (ref 8–27)
Bilirubin Total: 0.4 mg/dL (ref 0.0–1.2)
CO2: 21 mmol/L (ref 20–29)
Calcium: 9.7 mg/dL (ref 8.7–10.3)
Chloride: 104 mmol/L (ref 96–106)
Creatinine, Ser: 0.79 mg/dL (ref 0.57–1.00)
Globulin, Total: 2.2 g/dL (ref 1.5–4.5)
Glucose: 79 mg/dL (ref 70–99)
Potassium: 4.3 mmol/L (ref 3.5–5.2)
Sodium: 142 mmol/L (ref 134–144)
Total Protein: 6.8 g/dL (ref 6.0–8.5)
eGFR: 80 mL/min/1.73

## 2024-08-29 ENCOUNTER — Other Ambulatory Visit: Payer: Self-pay

## 2024-08-29 DIAGNOSIS — R011 Cardiac murmur, unspecified: Secondary | ICD-10-CM

## 2024-09-04 ENCOUNTER — Ambulatory Visit

## 2024-09-04 DIAGNOSIS — R011 Cardiac murmur, unspecified: Secondary | ICD-10-CM

## 2024-09-04 LAB — ECHOCARDIOGRAM COMPLETE
AR max vel: 2.48 cm2
AV Area VTI: 2.37 cm2
AV Area mean vel: 2.39 cm2
AV Mean grad: 7 mmHg
AV Peak grad: 13.2 mmHg
Ao pk vel: 1.82 m/s
Area-P 1/2: 2.95 cm2
S' Lateral: 3.09 cm

## 2024-09-05 ENCOUNTER — Ambulatory Visit: Payer: Self-pay | Admitting: Internal Medicine

## 2024-09-05 ENCOUNTER — Telehealth (HOSPITAL_COMMUNITY): Payer: Self-pay | Admitting: Emergency Medicine

## 2024-09-05 NOTE — Telephone Encounter (Signed)
 Attempted to call patient regarding upcoming cardiac CT appointment. Left message on voicemail with name and callback number Rockwell Alexandria RN Navigator Cardiac Imaging [facility] [phone] Office [phone] Cell

## 2024-09-06 ENCOUNTER — Ambulatory Visit

## 2024-09-26 ENCOUNTER — Ambulatory Visit: Admitting: Internal Medicine

## 2024-09-27 ENCOUNTER — Ambulatory Visit: Admitting: Medical
